# Patient Record
Sex: Male | Born: 1990 | Race: Black or African American | Hispanic: No | Marital: Single | Smoking: Current every day smoker
Health system: Southern US, Community
[De-identification: ages and names within clinical notes are randomized; demographics above are authoritative.]

## PROBLEM LIST (undated history)

## (undated) DIAGNOSIS — F1910 Other psychoactive substance abuse, uncomplicated: Secondary | ICD-10-CM

## (undated) DIAGNOSIS — S71132A Puncture wound without foreign body, left thigh, initial encounter: Secondary | ICD-10-CM

## (undated) DIAGNOSIS — F32A Depression, unspecified: Secondary | ICD-10-CM

## (undated) DIAGNOSIS — F99 Mental disorder, not otherwise specified: Secondary | ICD-10-CM

## (undated) DIAGNOSIS — B182 Chronic viral hepatitis C: Secondary | ICD-10-CM

## (undated) DIAGNOSIS — W3400XA Accidental discharge from unspecified firearms or gun, initial encounter: Secondary | ICD-10-CM

## (undated) DIAGNOSIS — F419 Anxiety disorder, unspecified: Secondary | ICD-10-CM

## (undated) DIAGNOSIS — F329 Major depressive disorder, single episode, unspecified: Secondary | ICD-10-CM

## (undated) HISTORY — PX: OTHER SURGICAL HISTORY: SHX169

## (undated) HISTORY — PX: MOUTH SURGERY: SHX715

---
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
## 2008-02-17 ENCOUNTER — Emergency Department (HOSPITAL_COMMUNITY): Admission: EM | Admit: 2008-02-17 | Discharge: 2008-02-17 | Payer: Self-pay | Admitting: Emergency Medicine

## 2008-12-11 ENCOUNTER — Emergency Department (HOSPITAL_COMMUNITY): Admission: EM | Admit: 2008-12-11 | Discharge: 2008-12-11 | Payer: Self-pay | Admitting: Pediatrics

## 2009-01-02 ENCOUNTER — Emergency Department (HOSPITAL_COMMUNITY): Admission: EM | Admit: 2009-01-02 | Discharge: 2009-01-02 | Payer: Self-pay | Admitting: Family Medicine

## 2009-01-07 ENCOUNTER — Emergency Department (HOSPITAL_COMMUNITY): Admission: EM | Admit: 2009-01-07 | Discharge: 2009-01-07 | Payer: Self-pay | Admitting: Family Medicine

## 2009-01-09 ENCOUNTER — Emergency Department (HOSPITAL_COMMUNITY): Admission: EM | Admit: 2009-01-09 | Discharge: 2009-01-09 | Payer: Self-pay | Admitting: Family Medicine

## 2009-01-19 ENCOUNTER — Emergency Department (HOSPITAL_COMMUNITY): Admission: EM | Admit: 2009-01-19 | Discharge: 2009-01-19 | Payer: Self-pay | Admitting: Emergency Medicine

## 2009-11-03 ENCOUNTER — Emergency Department (HOSPITAL_COMMUNITY): Admission: EM | Admit: 2009-11-03 | Discharge: 2009-11-03 | Payer: Self-pay | Admitting: Family Medicine

## 2009-12-16 ENCOUNTER — Emergency Department (HOSPITAL_COMMUNITY): Admission: EM | Admit: 2009-12-16 | Discharge: 2009-12-16 | Payer: Self-pay | Admitting: Emergency Medicine

## 2009-12-21 ENCOUNTER — Emergency Department (HOSPITAL_COMMUNITY): Admission: EM | Admit: 2009-12-21 | Discharge: 2009-12-22 | Payer: Self-pay | Admitting: Emergency Medicine

## 2010-03-21 ENCOUNTER — Emergency Department (HOSPITAL_COMMUNITY): Admission: EM | Admit: 2010-03-21 | Discharge: 2010-03-22 | Payer: Self-pay | Admitting: Emergency Medicine

## 2010-04-15 ENCOUNTER — Emergency Department (HOSPITAL_COMMUNITY): Admission: EM | Admit: 2010-04-15 | Discharge: 2010-04-16 | Payer: Self-pay | Admitting: Emergency Medicine

## 2010-05-09 ENCOUNTER — Emergency Department (HOSPITAL_COMMUNITY): Admission: EM | Admit: 2010-05-09 | Discharge: 2010-05-09 | Payer: Self-pay | Admitting: Emergency Medicine

## 2010-05-15 ENCOUNTER — Emergency Department (HOSPITAL_COMMUNITY): Admission: EM | Admit: 2010-05-15 | Discharge: 2010-05-15 | Payer: Self-pay | Admitting: Emergency Medicine

## 2010-11-08 ENCOUNTER — Emergency Department (HOSPITAL_COMMUNITY)
Admission: EM | Admit: 2010-11-08 | Discharge: 2010-11-08 | Payer: Self-pay | Source: Home / Self Care | Admitting: Emergency Medicine

## 2010-11-10 ENCOUNTER — Emergency Department (HOSPITAL_COMMUNITY)
Admission: EM | Admit: 2010-11-10 | Discharge: 2010-11-10 | Payer: Self-pay | Source: Home / Self Care | Admitting: Emergency Medicine

## 2011-01-10 ENCOUNTER — Emergency Department (HOSPITAL_COMMUNITY)
Admission: EM | Admit: 2011-01-10 | Discharge: 2011-01-10 | Disposition: A | Discharge status: Home / Self Care | Payer: Medicaid Other | Attending: Emergency Medicine | Admitting: Emergency Medicine

## 2011-01-10 DIAGNOSIS — J069 Acute upper respiratory infection, unspecified: Secondary | ICD-10-CM | POA: Insufficient documentation

## 2011-01-10 DIAGNOSIS — I10 Essential (primary) hypertension: Secondary | ICD-10-CM | POA: Insufficient documentation

## 2011-01-10 DIAGNOSIS — Z8673 Personal history of transient ischemic attack (TIA), and cerebral infarction without residual deficits: Secondary | ICD-10-CM | POA: Insufficient documentation

## 2011-01-10 DIAGNOSIS — J3489 Other specified disorders of nose and nasal sinuses: Secondary | ICD-10-CM | POA: Insufficient documentation

## 2011-02-16 LAB — URINE MICROSCOPIC-ADD ON

## 2011-02-16 LAB — URINALYSIS, ROUTINE W REFLEX MICROSCOPIC
Bilirubin Urine: NEGATIVE
Glucose, UA: NEGATIVE mg/dL
Hgb urine dipstick: NEGATIVE
Specific Gravity, Urine: 1.024 (ref 1.005–1.030)

## 2011-02-16 LAB — URINE CULTURE

## 2011-02-16 LAB — GC/CHLAMYDIA PROBE AMP, GENITAL
Chlamydia, DNA Probe: POSITIVE — AB
GC Probe Amp, Genital: NEGATIVE

## 2011-03-10 ENCOUNTER — Emergency Department (HOSPITAL_COMMUNITY)
Admission: EM | Admit: 2011-03-10 | Discharge: 2011-03-10 | Disposition: A | Discharge status: Home / Self Care | Payer: Medicaid Other | Attending: Emergency Medicine | Admitting: Emergency Medicine

## 2011-03-10 DIAGNOSIS — IMO0001 Reserved for inherently not codable concepts without codable children: Secondary | ICD-10-CM | POA: Insufficient documentation

## 2011-03-10 DIAGNOSIS — J069 Acute upper respiratory infection, unspecified: Secondary | ICD-10-CM | POA: Insufficient documentation

## 2011-03-10 DIAGNOSIS — R6883 Chills (without fever): Secondary | ICD-10-CM | POA: Insufficient documentation

## 2011-03-10 DIAGNOSIS — J3489 Other specified disorders of nose and nasal sinuses: Secondary | ICD-10-CM | POA: Insufficient documentation

## 2011-03-10 DIAGNOSIS — R05 Cough: Secondary | ICD-10-CM | POA: Insufficient documentation

## 2011-03-10 DIAGNOSIS — R059 Cough, unspecified: Secondary | ICD-10-CM | POA: Insufficient documentation

## 2011-03-10 DIAGNOSIS — B9789 Other viral agents as the cause of diseases classified elsewhere: Secondary | ICD-10-CM | POA: Insufficient documentation

## 2011-03-10 DIAGNOSIS — R0982 Postnasal drip: Secondary | ICD-10-CM | POA: Insufficient documentation

## 2011-03-10 DIAGNOSIS — M5430 Sciatica, unspecified side: Secondary | ICD-10-CM | POA: Insufficient documentation

## 2011-03-10 DIAGNOSIS — J029 Acute pharyngitis, unspecified: Secondary | ICD-10-CM | POA: Insufficient documentation

## 2011-03-13 ENCOUNTER — Emergency Department (HOSPITAL_COMMUNITY)
Admission: EM | Admit: 2011-03-13 | Discharge: 2011-03-13 | Disposition: A | Discharge status: Home / Self Care | Payer: Medicaid Other | Source: Home / Self Care | Attending: Emergency Medicine | Admitting: Emergency Medicine

## 2011-03-13 ENCOUNTER — Emergency Department (HOSPITAL_COMMUNITY)
Admission: EM | Admit: 2011-03-13 | Discharge: 2011-03-13 | Disposition: A | Discharge status: Home / Self Care | Payer: Medicaid Other | Attending: Emergency Medicine | Admitting: Emergency Medicine

## 2011-03-13 ENCOUNTER — Emergency Department (HOSPITAL_COMMUNITY): Payer: Medicaid Other

## 2011-03-13 ENCOUNTER — Emergency Department (HOSPITAL_COMMUNITY)
Admission: EM | Admit: 2011-03-13 | Discharge: 2011-03-13 | Disposition: A | Discharge status: Home / Self Care | Payer: Self-pay | Attending: Emergency Medicine | Admitting: Emergency Medicine

## 2011-03-13 DIAGNOSIS — M79609 Pain in unspecified limb: Secondary | ICD-10-CM | POA: Insufficient documentation

## 2011-03-13 DIAGNOSIS — IMO0001 Reserved for inherently not codable concepts without codable children: Secondary | ICD-10-CM | POA: Insufficient documentation

## 2011-03-13 DIAGNOSIS — M25569 Pain in unspecified knee: Secondary | ICD-10-CM | POA: Insufficient documentation

## 2011-03-15 ENCOUNTER — Ambulatory Visit (HOSPITAL_COMMUNITY)
Admission: RE | Admit: 2011-03-15 | Discharge: 2011-03-15 | Disposition: A | Discharge status: Home / Self Care | Payer: Medicaid Other | Source: Ambulatory Visit | Attending: Emergency Medicine | Admitting: Emergency Medicine

## 2011-03-15 ENCOUNTER — Other Ambulatory Visit (HOSPITAL_COMMUNITY): Payer: Self-pay | Admitting: Emergency Medicine

## 2011-03-15 DIAGNOSIS — M79605 Pain in left leg: Secondary | ICD-10-CM

## 2011-03-15 DIAGNOSIS — M79609 Pain in unspecified limb: Secondary | ICD-10-CM | POA: Insufficient documentation

## 2011-03-15 DIAGNOSIS — M25559 Pain in unspecified hip: Secondary | ICD-10-CM | POA: Insufficient documentation

## 2011-03-15 DIAGNOSIS — M25569 Pain in unspecified knee: Secondary | ICD-10-CM | POA: Insufficient documentation

## 2011-03-23 LAB — CBC
HCT: 41.4 % (ref 39.0–52.0)
Hemoglobin: 14.1 g/dL (ref 13.0–17.0)
MCHC: 34 g/dL (ref 30.0–36.0)
MCV: 86.4 fL (ref 78.0–100.0)
RBC: 4.79 MIL/uL (ref 4.22–5.81)
WBC: 7.2 10*3/uL (ref 4.0–10.5)

## 2011-03-23 LAB — PROTIME-INR: Prothrombin Time: 14.2 seconds (ref 11.6–15.2)

## 2011-05-18 ENCOUNTER — Emergency Department (HOSPITAL_BASED_OUTPATIENT_CLINIC_OR_DEPARTMENT_OTHER)
Admission: EM | Admit: 2011-05-18 | Discharge: 2011-05-18 | Disposition: A | Discharge status: Home / Self Care | Payer: Medicaid Other | Attending: Emergency Medicine | Admitting: Emergency Medicine

## 2011-05-18 DIAGNOSIS — S90569A Insect bite (nonvenomous), unspecified ankle, initial encounter: Secondary | ICD-10-CM | POA: Insufficient documentation

## 2011-05-18 DIAGNOSIS — IMO0001 Reserved for inherently not codable concepts without codable children: Secondary | ICD-10-CM | POA: Insufficient documentation

## 2011-05-18 DIAGNOSIS — Y92009 Unspecified place in unspecified non-institutional (private) residence as the place of occurrence of the external cause: Secondary | ICD-10-CM | POA: Insufficient documentation

## 2011-05-18 DIAGNOSIS — W57XXXA Bitten or stung by nonvenomous insect and other nonvenomous arthropods, initial encounter: Secondary | ICD-10-CM | POA: Insufficient documentation

## 2011-05-18 DIAGNOSIS — R21 Rash and other nonspecific skin eruption: Secondary | ICD-10-CM | POA: Insufficient documentation

## 2011-08-30 LAB — TYPE AND SCREEN
ABO/RH(D): A POS
Antibody Screen: NEGATIVE

## 2011-08-30 LAB — CBC
MCV: 84.9
Platelets: 118 — ABNORMAL LOW
WBC: 7.3

## 2011-08-30 LAB — PROTIME-INR
INR: 1.2
Prothrombin Time: 15.7 — ABNORMAL HIGH

## 2011-08-30 LAB — I-STAT 8, (EC8 V) (CONVERTED LAB)
BUN: 18
Bicarbonate: 24.7 — ABNORMAL HIGH
Glucose, Bld: 126 — ABNORMAL HIGH
Hemoglobin: 14.6
Operator id: 161631
pCO2, Ven: 39.7 — ABNORMAL LOW

## 2011-08-30 LAB — ABO/RH: ABO/RH(D): A POS

## 2011-08-30 LAB — DIFFERENTIAL
Eosinophils Absolute: 0
Lymphs Abs: 2.7
Neutro Abs: 4.1
Neutrophils Relative %: 56

## 2011-08-30 LAB — POCT I-STAT CREATININE: Operator id: 161631

## 2011-11-19 ENCOUNTER — Emergency Department (HOSPITAL_COMMUNITY)
Admission: EM | Admit: 2011-11-19 | Discharge: 2011-11-20 | Disposition: A | Discharge status: Home / Self Care | Payer: Self-pay | Attending: Emergency Medicine | Admitting: Emergency Medicine

## 2011-11-19 ENCOUNTER — Other Ambulatory Visit (HOSPITAL_COMMUNITY): Payer: Self-pay

## 2011-11-19 ENCOUNTER — Encounter: Payer: Self-pay | Admitting: Emergency Medicine

## 2011-11-19 ENCOUNTER — Emergency Department (HOSPITAL_COMMUNITY): Payer: Self-pay

## 2011-11-19 DIAGNOSIS — R011 Cardiac murmur, unspecified: Secondary | ICD-10-CM | POA: Insufficient documentation

## 2011-11-19 DIAGNOSIS — R1110 Vomiting, unspecified: Secondary | ICD-10-CM | POA: Insufficient documentation

## 2011-11-19 DIAGNOSIS — R509 Fever, unspecified: Secondary | ICD-10-CM | POA: Insufficient documentation

## 2011-11-19 DIAGNOSIS — R51 Headache: Secondary | ICD-10-CM | POA: Insufficient documentation

## 2011-11-19 DIAGNOSIS — R05 Cough: Secondary | ICD-10-CM | POA: Insufficient documentation

## 2011-11-19 DIAGNOSIS — R059 Cough, unspecified: Secondary | ICD-10-CM | POA: Insufficient documentation

## 2011-11-19 DIAGNOSIS — R6889 Other general symptoms and signs: Secondary | ICD-10-CM | POA: Insufficient documentation

## 2011-11-19 HISTORY — DX: Puncture wound without foreign body, left thigh, initial encounter: S71.132A

## 2011-11-19 HISTORY — DX: Accidental discharge from unspecified firearms or gun, initial encounter: W34.00XA

## 2011-11-19 MED ORDER — IBUPROFEN 800 MG PO TABS
800.0000 mg | ORAL_TABLET | Freq: Once | ORAL | Status: AC
  Administered 2011-11-19: 800 mg via ORAL
  Filled 2011-11-19: qty 1

## 2011-11-19 MED ORDER — HYDROCODONE-ACETAMINOPHEN 5-325 MG PO TABS
1.0000 | ORAL_TABLET | Freq: Once | ORAL | Status: AC
  Administered 2011-11-19: 1 via ORAL
  Filled 2011-11-19: qty 1

## 2011-11-19 MED ORDER — ONDANSETRON HCL 4 MG PO TABS: 4.0000 mg | ORAL_TABLET | Freq: Four times a day (QID) | ORAL | Status: AC

## 2011-11-19 MED ORDER — ALBUTEROL SULFATE (5 MG/ML) 0.5% IN NEBU
5.0000 mg | INHALATION_SOLUTION | Freq: Once | RESPIRATORY_TRACT | Status: AC
  Administered 2011-11-19: 5 mg via RESPIRATORY_TRACT
  Filled 2011-11-19: qty 1

## 2011-11-19 MED ORDER — ONDANSETRON 8 MG PO TBDP
8.0000 mg | ORAL_TABLET | Freq: Once | ORAL | Status: AC
  Administered 2011-11-19: 8 mg via ORAL
  Filled 2011-11-19: qty 1

## 2011-11-19 MED ORDER — IPRATROPIUM BROMIDE 0.02 % IN SOLN
0.5000 mg | Freq: Once | RESPIRATORY_TRACT | Status: AC
  Administered 2011-11-19: 0.5 mg via RESPIRATORY_TRACT
  Filled 2011-11-19: qty 2.5

## 2011-11-19 NOTE — ED Notes (Signed)
Pt states starting last night he has had a cough, fever, vomiting, and body aches
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
## 2011-11-19 NOTE — ED Notes (Signed)
Pt states that, as of last night, he has felt weak, dehydrated, and had body aches.  Pt feels pain upon deep inspiration, lung fields found to be clear upon auscultation.

## 2012-01-07 NOTE — ED Provider Notes (Signed)
Medical screening examination/treatment/procedure(s) were performed by non-physician practitioner and as supervising physician I was immediately available for consultation/collaboration.   Juaquina Machnik A. Amonda Brillhart, MD 01/07/12 1455 

## 2012-05-04 ENCOUNTER — Encounter (HOSPITAL_COMMUNITY): Payer: Self-pay | Admitting: *Deleted

## 2012-05-04 ENCOUNTER — Emergency Department (HOSPITAL_COMMUNITY)
Admission: EM | Admit: 2012-05-04 | Discharge: 2012-05-05 | Disposition: A | Discharge status: Home / Self Care | Payer: Self-pay | Attending: Emergency Medicine | Admitting: Emergency Medicine

## 2012-05-04 DIAGNOSIS — K047 Periapical abscess without sinus: Secondary | ICD-10-CM | POA: Insufficient documentation

## 2012-05-04 NOTE — ED Notes (Signed)
Pt c/o lower right tooth pain x's 2 days. States can't afford a dentist right now. Pt also reports he was told previously to come to ER for antibiotics.

## 2012-05-05 MED ORDER — HYDROMORPHONE HCL PF 2 MG/ML IJ SOLN
2.0000 mg | Freq: Once | INTRAMUSCULAR | Status: AC
  Administered 2012-05-05: 2 mg via INTRAMUSCULAR
  Filled 2012-05-05: qty 1

## 2012-05-05 MED ORDER — PENICILLIN V POTASSIUM 500 MG PO TABS
500.0000 mg | ORAL_TABLET | Freq: Once | ORAL | Status: AC
  Administered 2012-05-05: 500 mg via ORAL
  Filled 2012-05-05: qty 1

## 2012-05-05 MED ORDER — IBUPROFEN 600 MG PO TABS: 600.0000 mg | ORAL_TABLET | Freq: Three times a day (TID) | ORAL | Status: AC | PRN

## 2012-05-05 MED ORDER — PENICILLIN V POTASSIUM 500 MG PO TABS: 500.0000 mg | ORAL_TABLET | Freq: Four times a day (QID) | ORAL | Status: AC

## 2012-05-05 MED ORDER — OXYCODONE-ACETAMINOPHEN 5-325 MG PO TABS: 1.0000 | ORAL_TABLET | ORAL | Status: AC | PRN

## 2012-05-05 NOTE — Discharge Instructions (Signed)
Diet and Dental Disease What you eat affects the health of your teeth. Diet plays an important role in developing healthy teeth and preventing dental disease, such as:  Tooth decay.   Gum (periodontal) disease.   Developmental defects of the enamel. This is when visible surfaces of the tooth do not form properly, leaving the tooth more prone to decay.   Dental erosion. This is when the teeth wear away.  Knowing which foods promote strong teeth and which foods to stay away from can help you prevent poor oral health. If your diet lacks proper nutrients, it may be difficult for the tissues in your mouth to prevent dental disease. FOODS THAT PROMOTE DENTAL DISEASE The following foods either contain acids or create acid in your mouth that increases the risk of tooth decay:  Sugary foods, such as candy and baked goods (cookies, cake).   Soft drinks (carbonated and non-carbonated) such as soda, sports drinks, and fruit juice.   Citrus fruits, such as oranges and lemons.   Berries.   Honey.   Herbal teas that contain berries and other fruits.   Wines and other alcoholic beverages.   Vinegar or vinegar containing foods, such as pickles.   Starchy snacks such as crackers, potato chips, [place] fries, and pasta.  Some of these foods have health benefits. Eat these foods in moderation. The more often you eat these foods, the more frequently you are exposing your teeth to the acid that causes dental diseases. FOODS THAT REDUCE THE RISK OF DENTAL DISEASE Certain foods help to keep the teeth strong and reduce the risk of tooth decay. These foods include:  Dairy products, such as cow's milk and cheese. Eating dairy with a meal or sugary snack reduces the risk of tooth decay.   Gums and foods that substitute sugar with sorbitol, mannitol, and xylitol.   Fluoride containing foods, such as black tea. Fluoride is a natural mineral that protects the teeth from tooth decay. Your caregiver may  recommend fluoride toothpaste or a fluoride supplement.   Breast milk.  DIETARY RECOMMENDATIONS FOR HEALTHY TEETH  Eat a healthy, well-balanced diet with fiber-rich fruits and vegetables and quality proteins (eggs, meat, poultry, and fish). A variety of foods each day in moderation is best.   Avoid frequent sugary snacks in between meals.   Avoid frequent sticky, chewy, sugary candies, such as gummy bears and other candies that stick to the teeth. Avoid sucking on candies for a long time.   Avoid drinks that contain added sugar. Even though they do not sit in the mouth for very long, they can promote tooth decay if consumed too frequently.   Avoid sugary foods and drinks late at night.   Avoid swishing or holding acidic or sugary drinks in your mouth. Using a straw limits contact with the teeth.   If you like frequent sugary treats, try eating a sugary dessert after a meal or with a dairy product, rather than eating it by itself.   Avoid starchy foods such as graham crackers that stick to your teeth.   Eat highly acidic and sugary foods in moderation, especially if you tend to develop tooth decay. Eat citrus fruits or drinks 2 times per day or less. Limit foods with vinegar and sports drinks to 1 time per week.   Try rinsing your mouth with water after a sugary or acidic meal or drink. Rinsing may help to reduce the acid buildup in the mouth.   Limit alcohol.   Read  labels to determine the amount of sugar in foods.  PRACTICE GOOD DAILY ORAL HYGIENE   Have your teeth professionally cleaned at the dentist every 6 months.   Brush twice daily with a fluoride toothpaste.   Floss between your teeth daily.   Ask your caregiver if you need fluoride supplements or treatments.   Ask your caregiver if you should have sealants applied to some of your teeth.  HOME CARE INSTRUCTIONS  Follow the guidelines included here to promote good oral health.   Follow all of your caregiver's  instructions for managing your health condition(s).   See your caregiver for follow-up exams as directed.  Document Released: 07/21/2011 Document Revised: 11/11/2011 Document Reviewed: 07/21/2011 [facility] Patient Information 2012 [place], [place].
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
## 2013-05-30 ENCOUNTER — Emergency Department (HOSPITAL_COMMUNITY): Payer: Medicaid Other

## 2013-05-30 ENCOUNTER — Emergency Department (HOSPITAL_COMMUNITY)
Admission: EM | Admit: 2013-05-30 | Discharge: 2013-05-31 | Disposition: A | Discharge status: Home / Self Care | Payer: Medicaid Other | Attending: Emergency Medicine | Admitting: Emergency Medicine

## 2013-05-30 DIAGNOSIS — S6990XA Unspecified injury of unspecified wrist, hand and finger(s), initial encounter: Secondary | ICD-10-CM | POA: Insufficient documentation

## 2013-05-30 DIAGNOSIS — S0993XA Unspecified injury of face, initial encounter: Secondary | ICD-10-CM | POA: Insufficient documentation

## 2013-05-30 DIAGNOSIS — Z87828 Personal history of other (healed) physical injury and trauma: Secondary | ICD-10-CM | POA: Insufficient documentation

## 2013-05-30 DIAGNOSIS — S99929A Unspecified injury of unspecified foot, initial encounter: Secondary | ICD-10-CM | POA: Insufficient documentation

## 2013-05-30 DIAGNOSIS — S8990XA Unspecified injury of unspecified lower leg, initial encounter: Secondary | ICD-10-CM | POA: Insufficient documentation

## 2013-05-30 DIAGNOSIS — Y92410 Unspecified street and highway as the place of occurrence of the external cause: Secondary | ICD-10-CM | POA: Insufficient documentation

## 2013-05-30 DIAGNOSIS — S59909A Unspecified injury of unspecified elbow, initial encounter: Secondary | ICD-10-CM | POA: Insufficient documentation

## 2013-05-30 DIAGNOSIS — S59919A Unspecified injury of unspecified forearm, initial encounter: Secondary | ICD-10-CM | POA: Insufficient documentation

## 2013-05-30 DIAGNOSIS — Y9389 Activity, other specified: Secondary | ICD-10-CM | POA: Insufficient documentation

## 2013-05-30 MED ORDER — IBUPROFEN 800 MG PO TABS: 800.0000 mg | ORAL_TABLET | Freq: Three times a day (TID) | ORAL | Status: DC

## 2013-05-30 MED ORDER — HYDROCODONE-ACETAMINOPHEN 5-325 MG PO TABS: 2.0000 | ORAL_TABLET | Freq: Four times a day (QID) | ORAL | Status: DC | PRN

## 2013-05-30 MED ORDER — OXYCODONE-ACETAMINOPHEN 5-325 MG PO TABS
2.0000 | ORAL_TABLET | Freq: Once | ORAL | Status: AC
  Administered 2013-05-30: 2 via ORAL
  Filled 2013-05-30: qty 2
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
## 2013-05-31 NOTE — ED Provider Notes (Signed)
Medical screening examination/treatment/procedure(s) were performed by non-physician practitioner and as supervising physician I was immediately available for consultation/collaboration. Devoria Albe, MD, Armando Gang   Ward Givens, MD 05/31/13 1501

## 2013-08-16 ENCOUNTER — Emergency Department (HOSPITAL_COMMUNITY)
Admission: EM | Admit: 2013-08-16 | Discharge: 2013-08-17 | Disposition: A | Discharge status: Home / Self Care | Payer: Medicaid Other | Attending: Emergency Medicine | Admitting: Emergency Medicine

## 2013-08-16 ENCOUNTER — Encounter (HOSPITAL_COMMUNITY): Payer: Self-pay | Admitting: Emergency Medicine

## 2013-08-16 DIAGNOSIS — Z87828 Personal history of other (healed) physical injury and trauma: Secondary | ICD-10-CM | POA: Insufficient documentation

## 2013-08-16 DIAGNOSIS — B86 Scabies: Secondary | ICD-10-CM | POA: Insufficient documentation

## 2013-08-16 MED ORDER — PERMETHRIN 5 % EX CREA: TOPICAL_CREAM | CUTANEOUS | Status: DC

## 2013-08-16 NOTE — ED Notes (Signed)
Pt. reports multiple itchy insect bites at both arms onset yesterday .
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
## 2013-08-17 NOTE — ED Provider Notes (Signed)
Medical screening examination/treatment/procedure(s) were performed by non-physician practitioner and as supervising physician I was immediately available for consultation/collaboration.   Glynn Octave, MD 08/17/13 1329

## 2013-12-20 ENCOUNTER — Emergency Department (HOSPITAL_COMMUNITY)
Admission: EM | Admit: 2013-12-20 | Discharge: 2013-12-20 | Disposition: A | Discharge status: Home / Self Care | Payer: Self-pay | Attending: Emergency Medicine | Admitting: Emergency Medicine

## 2013-12-20 ENCOUNTER — Emergency Department (HOSPITAL_COMMUNITY): Payer: Self-pay

## 2013-12-20 ENCOUNTER — Encounter (HOSPITAL_COMMUNITY): Payer: Self-pay | Admitting: Emergency Medicine

## 2013-12-20 DIAGNOSIS — S0180XA Unspecified open wound of other part of head, initial encounter: Secondary | ICD-10-CM | POA: Insufficient documentation

## 2013-12-20 DIAGNOSIS — S0181XA Laceration without foreign body of other part of head, initial encounter: Secondary | ICD-10-CM

## 2013-12-20 DIAGNOSIS — S0101XA Laceration without foreign body of scalp, initial encounter: Secondary | ICD-10-CM

## 2013-12-20 DIAGNOSIS — S0993XA Unspecified injury of face, initial encounter: Secondary | ICD-10-CM | POA: Insufficient documentation

## 2013-12-20 DIAGNOSIS — S0100XA Unspecified open wound of scalp, initial encounter: Secondary | ICD-10-CM | POA: Insufficient documentation

## 2013-12-20 DIAGNOSIS — S199XXA Unspecified injury of neck, initial encounter: Secondary | ICD-10-CM

## 2013-12-20 DIAGNOSIS — Z23 Encounter for immunization: Secondary | ICD-10-CM | POA: Insufficient documentation

## 2013-12-20 MED ORDER — IBUPROFEN 400 MG PO TABS: 800.0000 mg | ORAL_TABLET | Freq: Once | ORAL | Status: DC

## 2013-12-20 MED ORDER — TETANUS-DIPHTH-ACELL PERTUSSIS 5-2.5-18.5 LF-MCG/0.5 IM SUSP
0.5000 mL | Freq: Once | INTRAMUSCULAR | Status: AC
  Administered 2013-12-20: 0.5 mL via INTRAMUSCULAR
  Filled 2013-12-20: qty 0.5
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
## 2013-12-20 NOTE — ED Provider Notes (Signed)
Medical screening examination/treatment/procedure(s) were performed by non-physician practitioner and as supervising physician I was immediately available for consultation/collaboration.  EKG Interpretation   None         William Rayden Scheper, MD 12/20/13 2336 

## 2013-12-20 NOTE — ED Notes (Signed)
PA at bedside.

## 2013-12-20 NOTE — Discharge Instructions (Signed)
1. Medications:  usual home medications 2. Treatment: rest, drink plenty of fluids, keep wounds clean, and bandages dry 3. Follow Up: Please followup in the ED for suture removal in 5 days  Follow up with your doctor, an urgent care, or this Emergency Department for removal of your stitches in 5 days. Do not submerge the stitches in water for the first 24 hours. Read instructions below.  TREATMENT   Keep the wound clean and dry.   If you were given a bandage (dressing), you should change it at least once a day. Also, change the dressing if it becomes wet or dirty, or as directed by your caregiver.   Wash the wound with soap and water 2 times a day. Rinse the wound off with water to remove all soap. Pat the wound dry with a clean towel.   You may shower as usual after the first 24 hours. Do not soak the wound in water until the sutures are removed.   Once the wound has healed, scarring can be minimized by covering the wound with sunscreen during the day for 1 full year.Marland Kitchen.   SEEK MEDICAL CARE IF:   You have redness, swelling, or increasing pain in the wound.   You see a red line that goes away from the wound.   You have yellowish-white fluid (pus) coming from the wound.   You have a fever.   You notice a bad smell coming from the wound or dressing.   Your wound breaks open before or after sutures have been removed.   You notice something coming out of the wound such as wood or glass.   Your wound is on your hand or foot and you cannot move a finger or toe.   Your pain is not controlled with prescribed medicine.   If you did not receive a tetanus shot today because you thought you were up to date, but did not recall when your last one was given, make sure to check with your primary caregiver to determine if you need one.
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
## 2013-12-31 ENCOUNTER — Emergency Department (HOSPITAL_COMMUNITY)
Admission: EM | Admit: 2013-12-31 | Discharge: 2013-12-31 | Disposition: A | Discharge status: Home / Self Care | Payer: Medicaid Other | Attending: Emergency Medicine | Admitting: Emergency Medicine

## 2013-12-31 ENCOUNTER — Emergency Department (HOSPITAL_COMMUNITY): Payer: Self-pay

## 2013-12-31 ENCOUNTER — Encounter (HOSPITAL_COMMUNITY): Payer: Self-pay | Admitting: Emergency Medicine

## 2013-12-31 DIAGNOSIS — Y9229 Other specified public building as the place of occurrence of the external cause: Secondary | ICD-10-CM | POA: Insufficient documentation

## 2013-12-31 DIAGNOSIS — Z4802 Encounter for removal of sutures: Secondary | ICD-10-CM

## 2013-12-31 DIAGNOSIS — W230XXA Caught, crushed, jammed, or pinched between moving objects, initial encounter: Secondary | ICD-10-CM | POA: Insufficient documentation

## 2013-12-31 DIAGNOSIS — Y939 Activity, unspecified: Secondary | ICD-10-CM | POA: Insufficient documentation

## 2013-12-31 DIAGNOSIS — S60229A Contusion of unspecified hand, initial encounter: Secondary | ICD-10-CM | POA: Insufficient documentation

## 2013-12-31 DIAGNOSIS — S60221A Contusion of right hand, initial encounter: Secondary | ICD-10-CM

## 2013-12-31 MED ORDER — HYDROCODONE-ACETAMINOPHEN 5-325 MG PO TABS: 2.0000 | ORAL_TABLET | ORAL | Status: DC | PRN

## 2013-12-31 NOTE — Progress Notes (Signed)
[facility] provided pt with a list of primary care resources and [facility] information.  °
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
## 2013-12-31 NOTE — Discharge Instructions (Signed)
Contusion °A contusion is a deep bruise. Contusions are the result of an injury that caused bleeding under the skin. The contusion may turn blue, purple, or yellow. Minor injuries will give you a painless contusion, but more severe contusions may stay painful and swollen for a few weeks.  °CAUSES  °A contusion is usually caused by a blow, trauma, or direct force to an area of the body. °SYMPTOMS  °· Swelling and redness of the injured area. °· Bruising of the injured area. °· Tenderness and soreness of the injured area. °· Pain. °DIAGNOSIS  °The diagnosis can be made by taking a history and physical exam. An X-ray, CT scan, or MRI may be needed to determine if there were any associated injuries, such as fractures. °TREATMENT  °Specific treatment will depend on what area of the body was injured. In general, the best treatment for a contusion is resting, icing, elevating, and applying cold compresses to the injured area. Over-the-counter medicines may also be recommended for pain control. Ask your caregiver what the best treatment is for your contusion. °HOME CARE INSTRUCTIONS  °· Put ice on the injured area. °· Put ice in a plastic bag. °· Place a towel between your skin and the bag. °· Leave the ice on for 15-20 minutes, 03-04 times a day. °· Only take over-the-counter or prescription medicines for pain, discomfort, or fever as directed by your caregiver. Your caregiver may recommend avoiding anti-inflammatory medicines (aspirin, ibuprofen, and naproxen) for 48 hours because these medicines may increase bruising. °· Rest the injured area. °· If possible, elevate the injured area to reduce swelling. °SEEK IMMEDIATE MEDICAL CARE IF:  °· You have increased bruising or swelling. °· You have pain that is getting worse. °· Your swelling or pain is not relieved with medicines. °MAKE SURE YOU:  °· Understand these instructions. °· Will watch your condition. °· Will get help right away if you are not doing well or get  worse. °Document Released: 09/01/2005 Document Revised: 02/14/2012 Document Reviewed: 09/27/2011 °ExitCare® Patient Information ©2014 [employer]. ° °Hand Contusion °A hand contusion is a deep bruise on your hand area. Contusions are the result of an injury that caused bleeding under the skin. The contusion may turn blue, purple, or yellow. Minor injuries will give you a painless contusion, but more severe contusions may stay painful and swollen for a few weeks. °CAUSES  °A contusion is usually caused by a blow, trauma, or direct force to an area of the body. °SYMPTOMS  °· Swelling and redness of the injured area. °· Discoloration of the injured area. °· Tenderness and soreness of the injured area. °· Pain. °DIAGNOSIS  °The diagnosis can be made by taking a history and performing a physical exam. An X-ray, CT scan, or MRI may be needed to determine if there were any associated injuries, such as broken bones (fractures). °TREATMENT  °Often, the best treatment for a hand contusion is resting, elevating, icing, and applying cold compresses to the injured area. Over-the-counter medicines may also be recommended for pain control. °HOME CARE INSTRUCTIONS  °· Put ice on the injured area. °· Put ice in a plastic bag. °· Place a towel between your skin and the bag. °· Leave the ice on for 15-20 minutes, 03-04 times a day. °· Only take over-the-counter or prescription medicines as directed by your caregiver. Your caregiver may recommend avoiding anti-inflammatory medicines (aspirin, ibuprofen, and naproxen) for 48 hours because these medicines may increase bruising. °· If told, use an elastic   wrap as directed. This can help reduce swelling. You may remove the wrap for sleeping, showering, and bathing. If your fingers become numb, cold, or blue, take the wrap off and reapply it more loosely.  Elevate your hand with pillows to reduce swelling.  Avoid overusing your hand if it is painful. SEEK IMMEDIATE MEDICAL CARE IF:    You have increased redness, swelling, or pain in your hand.  Your swelling or pain is not relieved with medicines.  You have loss of feeling in your hand or are unable to move your fingers.  Your hand turns cold or blue.  You have pain when you move your fingers.  Your hand becomes warm to the touch.  Your contusion does not improve in 2 days. MAKE SURE YOU:   Understand these instructions.  Will watch your condition.  Will get help right away if you are not doing well or get worse. Document Released: 05/14/2002 Document Revised: 08/16/2012 Document Reviewed: 05/15/2012 [facility] Patient Information 2014 [place]Care, [place]C.  Staple Removal Care After The staples used to close your skin have been removed. The wound needs continued care so it can heal completely and without problems. The care described here will need to be done for another 5-10 days unless your caregiver advises otherwise.  HOME CARE INSTRUCTIONS   Keep wound site dry and clean.  If skin adhesive strips were applied after the staples were removed, they will begin to peel off in a few days. If they remain after fourteen days, they may be peeled off and discarded.  If you still have a dressing, change it at least once a day or as instructed by your caregiver. If the bandage sticks, soak it off with warm water. Pat dry with a clean towel. Look for signs of infection (see below).  Reapply cream or ointment according to your caregiver's instruction. This will help prevent infection and keep the bandage from sticking. Use of a non-stick material over the wound and under the dressing or wrap will also help keep the bandage from sticking.  If the bandage becomes wet, dirty or develops a foul smell, change it as soon as possible.  New scars become sunburned easily. Use sunscreens with protection factor (SPF) of at least 15 when out in the sun.  Only take over-the-counter or prescription medicines for pain, discomfort or  fever as directed by your caregiver. SEEK IMMEDIATE MEDICAL CARE IF:   There is redness, swelling or increasing pain in the wound.  Pus is coming from the wound.  An unexplained oral temperature above 102 F (38.9 C) develops.  You notice a foul smell coming from the wound or dressing.  There is a breaking open of the suture line (edges not staying together) of the wound edges after staples have been removed. Document Released: 11/04/2008 Document Revised: 02/14/2012 Document Reviewed: 11/04/2008 [facility] Patient Information 2014 [place]ExitCare, [place]LLC.

## 2013-12-31 NOTE — ED Provider Notes (Signed)
Medical screening examination/treatment/procedure(s) were performed by non-physician practitioner and as supervising physician I was immediately available for consultation/collaboration.  EKG Interpretation   None        Yamaris Cummings F Deforest Maiden, MD 12/31/13 1935 

## 2014-01-15 ENCOUNTER — Encounter (HOSPITAL_COMMUNITY): Payer: Self-pay | Admitting: Emergency Medicine

## 2014-01-15 ENCOUNTER — Emergency Department (HOSPITAL_COMMUNITY)
Admission: EM | Admit: 2014-01-15 | Discharge: 2014-01-15 | Disposition: A | Discharge status: Home / Self Care | Payer: No Typology Code available for payment source | Attending: Emergency Medicine | Admitting: Emergency Medicine

## 2014-01-15 DIAGNOSIS — W57XXXA Bitten or stung by nonvenomous insect and other nonvenomous arthropods, initial encounter: Principal | ICD-10-CM

## 2014-01-15 DIAGNOSIS — H532 Diplopia: Secondary | ICD-10-CM | POA: Insufficient documentation

## 2014-01-15 DIAGNOSIS — L03211 Cellulitis of face: Secondary | ICD-10-CM | POA: Insufficient documentation

## 2014-01-15 DIAGNOSIS — L0201 Cutaneous abscess of face: Secondary | ICD-10-CM | POA: Insufficient documentation

## 2014-01-15 DIAGNOSIS — L03119 Cellulitis of unspecified part of limb: Secondary | ICD-10-CM

## 2014-01-15 DIAGNOSIS — Z87828 Personal history of other (healed) physical injury and trauma: Secondary | ICD-10-CM | POA: Insufficient documentation

## 2014-01-15 DIAGNOSIS — S60569A Insect bite (nonvenomous) of unspecified hand, initial encounter: Secondary | ICD-10-CM

## 2014-01-15 DIAGNOSIS — Y9389 Activity, other specified: Secondary | ICD-10-CM | POA: Insufficient documentation

## 2014-01-15 DIAGNOSIS — R51 Headache: Secondary | ICD-10-CM | POA: Insufficient documentation

## 2014-01-15 DIAGNOSIS — L02519 Cutaneous abscess of unspecified hand: Secondary | ICD-10-CM | POA: Insufficient documentation

## 2014-01-15 DIAGNOSIS — S60469A Insect bite (nonvenomous) of unspecified finger, initial encounter: Principal | ICD-10-CM

## 2014-01-15 DIAGNOSIS — Y9229 Other specified public building as the place of occurrence of the external cause: Secondary | ICD-10-CM | POA: Insufficient documentation

## 2014-01-15 DIAGNOSIS — L089 Local infection of the skin and subcutaneous tissue, unspecified: Secondary | ICD-10-CM | POA: Insufficient documentation

## 2014-01-15 DIAGNOSIS — L0390 Cellulitis, unspecified: Secondary | ICD-10-CM

## 2014-01-15 DIAGNOSIS — R21 Rash and other nonspecific skin eruption: Secondary | ICD-10-CM | POA: Insufficient documentation

## 2014-01-15 MED ORDER — CLINDAMYCIN HCL 150 MG PO CAPS: 150.0000 mg | ORAL_CAPSULE | Freq: Three times a day (TID) | ORAL | Status: DC

## 2014-01-15 MED ORDER — HYDROCODONE-ACETAMINOPHEN 5-325 MG PO TABS: 1.0000 | ORAL_TABLET | ORAL | Status: DC | PRN

## 2014-01-15 MED ORDER — CLINDAMYCIN HCL 150 MG PO CAPS: 450.0000 mg | ORAL_CAPSULE | Freq: Three times a day (TID) | ORAL | Status: DC

## 2014-01-15 NOTE — Discharge Instructions (Signed)
Call for a follow up appointment with a Family or Primary Care Provider.  °Return if Symptoms worsen.   °Take medication as prescribed.  ° ° ° °Emergency Department Resource Guide °1) Find a Doctor and Pay Out of Pocket °Although you won't have to find out who is covered by your insurance plan, it is a good idea to ask around and get recommendations. You will then need to call the office and see if the doctor you have chosen will accept you as a new patient and what types of options they offer for patients who are self-pay. Some doctors offer discounts or will set up payment plans for their patients who do not have insurance, but you will need to ask so you aren't surprised when you get to your appointment. ° °2) Contact Your Local Health Department °Not all health departments have doctors that can see patients for sick visits, but many do, so it is worth a call to see if yours does. If you don't know where your local health department is, you can check in your phone book. The CDC also has a tool to help you locate your state's health department, and many state websites also have listings of all of their local health departments. ° °3) Find a Walk-in Clinic °If your illness is not likely to be very severe or complicated, you may want to try a walk in clinic. These are popping up all over the country in pharmacies, drugstores, and shopping centers. They're usually staffed by nurse practitioners or physician assistants that have been trained to treat common illnesses and complaints. They're usually fairly quick and inexpensive. However, if you have serious medical issues or chronic medical problems, these are probably not your best option. ° °No Primary Care Doctor: °- Call [facility] Connect at  832-8000 - they can help you locate a primary care doctor that  accepts your insurance, provides certain services, etc. °- Physician Referral Service- [phone] ° °Chronic Pain Problems: °Organization          Address  Phone   Notes  °[facility]  [phone] Patients need to be referred by their primary care doctor.  ° °Medication Assistance: °Organization         Address  Phone   Notes  °[place] [facility] [address] °[place], [place] [address] [phone] --Must be a resident of [place] °-- Must have NO insurance coverage whatsoever (no Medicaid/ Medicare, etc.) °-- The pt. MUST have a primary care doctor that directs their care regularly and follows them in the community °  °[facility]  (866) 331-1348   °[employer]  (888) 892-1162   ° °Agencies that provide inexpensive medical care: °Organization         Address  Phone   Notes  °[facility]  ([phone]   °[facility]    ([phone]) [phone]   °[facility] [address] °[place], [place] [address] [phone]   °[facility] [address], °[place] [phone]   °[facility]    ([phone]) [phone]   °[facility]    ([phone]) [phone]   °[facility] ° [address],  Phone:  ([phone]) [phone], Fax:  [phone] Hours of Operation:  9 am - 6 pm, M-F.  Also accepts Medicaid/Medicare and self-pay.  °[facility] ° [address],   [place] Phone: [phone]) [phone], Fax: [phone]. Hours of Operation:  8:30 am - 5:30 pm, M-F.  Also accepts Medicaid and self-pay.  [facility] [place] [address], [place] Phone: [phone]   [facility] [address], [place], [place] [phone], Ext. [phone] Mondays & Thursdays: 7-9 AM.  First 15 patients are seen on a first come, first serve basis.    Medicaid-accepting [facility] Providers:  Organization         Address  Phone   Notes  [facility] [address], [place] [phone] Also accepts self-pay patients.  [facility] [address], [place]  [phone]   [facility] [address], [place] [phone]   [facility] [address] [place] [address], [place] [phone]) [phone]   Renaye RakersVeita Bland [address], [place]   [phone] Only accepts [place] patients after they have their name applied to their card.   Self-Pay (no insurance) in [facility]:  Organization         Address  Phone   Notes  Sickle Cell Patients, [facility] [address], [place] [phone]) [phone]   [facility] [address], [place] [phone]) [phone]   [facility] [place]  [address], [place] [phone]   [facility]/Dr. Osei-Bonsu  [address] [place] [address], [place] or [phone], [place] [phone]) [phone] Phone number for both [place] and [place] locations is the same.  [facility] [address], [place] [phone]   [facility] [place] [address] [place] [address], [place] or [address] [phone]) [phone]   [facility] [address], [place] [phone], phone; [phone]) [phone], fax Sees patients 1st and 3rd Saturday of every month.  Must not qualify for public or private insurance (i.e. Medicaid, Medicare, [place] Health Choice, Veterans' Benefits)  Household income should be no more than 200% of the poverty level The clinic cannot treat you if you are pregnant or think you are pregnant  Sexually transmitted diseases are not treated at the clinic.

## 2014-01-15 NOTE — ED Notes (Addendum)
Pt report pain and itching from multiple red raised sites on hand and face. Pt awoke this am with multiple red raised spots on skin. Pt stated that he has been staying at a hotel for the last few weeks Pt is very sleepy, dozing off during assessment. VS stable. Denies recent drug or alcohol use
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
## 2014-01-19 NOTE — ED Provider Notes (Signed)
Medical screening examination/treatment/procedure(s) were performed by non-physician practitioner and as supervising physician I was immediately available for consultation/collaboration.  EKG Interpretation   None        Martha K Linker, MD 01/19/14 0708 

## 2014-01-23 ENCOUNTER — Emergency Department (HOSPITAL_COMMUNITY)
Admission: EM | Admit: 2014-01-23 | Discharge: 2014-01-23 | Disposition: A | Discharge status: Home / Self Care | Payer: No Typology Code available for payment source | Attending: Emergency Medicine | Admitting: Emergency Medicine

## 2014-01-23 ENCOUNTER — Encounter (HOSPITAL_COMMUNITY): Payer: Self-pay | Admitting: Emergency Medicine

## 2014-01-23 DIAGNOSIS — R0609 Other forms of dyspnea: Secondary | ICD-10-CM | POA: Insufficient documentation

## 2014-01-23 DIAGNOSIS — R4182 Altered mental status, unspecified: Secondary | ICD-10-CM | POA: Insufficient documentation

## 2014-01-23 DIAGNOSIS — Z792 Long term (current) use of antibiotics: Secondary | ICD-10-CM | POA: Insufficient documentation

## 2014-01-23 DIAGNOSIS — R0989 Other specified symptoms and signs involving the circulatory and respiratory systems: Secondary | ICD-10-CM | POA: Insufficient documentation

## 2014-01-23 DIAGNOSIS — Z87828 Personal history of other (healed) physical injury and trauma: Secondary | ICD-10-CM | POA: Insufficient documentation

## 2014-01-23 LAB — ETHANOL

## 2014-01-23 LAB — CBC
HCT: 44 % (ref 39.0–52.0)
HEMOGLOBIN: 14.7 g/dL (ref 13.0–17.0)
MCH: 29.1 pg (ref 26.0–34.0)
MCHC: 33.4 g/dL (ref 30.0–36.0)
MCV: 87 fL (ref 78.0–100.0)
Platelets: 139 10*3/uL — ABNORMAL LOW (ref 150–400)
RBC: 5.06 MIL/uL (ref 4.22–5.81)
RDW: 14.4 % (ref 11.5–15.5)
WBC: 14.8 10*3/uL — ABNORMAL HIGH (ref 4.0–10.5)

## 2014-01-23 LAB — COMPREHENSIVE METABOLIC PANEL
ALBUMIN: 4.5 g/dL (ref 3.5–5.2)
ALK PHOS: 44 U/L (ref 39–117)
ALT: 63 U/L — ABNORMAL HIGH (ref 0–53)
AST: 41 U/L — ABNORMAL HIGH (ref 0–37)
BILIRUBIN TOTAL: 0.4 mg/dL (ref 0.3–1.2)
BUN: 19 mg/dL (ref 6–23)
CO2: 23 mEq/L (ref 19–32)
CREATININE: 1.96 mg/dL — AB (ref 0.50–1.35)
Calcium: 8.9 mg/dL (ref 8.4–10.5)
Chloride: 99 mEq/L (ref 96–112)
GFR calc non Af Amer: 46 mL/min — ABNORMAL LOW (ref >90)
GFR, EST AFRICAN AMERICAN: 53 mL/min — AB (ref >90)
GLUCOSE: 75 mg/dL (ref 70–99)
POTASSIUM: 5.1 meq/L (ref 3.7–5.3)
Sodium: 138 mEq/L (ref 137–147)
TOTAL PROTEIN: 7.7 g/dL (ref 6.0–8.3)

## 2014-01-23 LAB — ACETAMINOPHEN LEVEL: Acetaminophen (Tylenol), Serum: <15 ug/mL (ref 10–30)

## 2014-01-23 LAB — SALICYLATE LEVEL

## 2014-01-23 MED ORDER — NALOXONE HCL 0.4 MG/ML IJ SOLN
INTRAMUSCULAR | Status: AC
  Administered 2014-01-23: 13:00:00
  Filled 2014-01-23: qty 1

## 2014-01-23 MED ORDER — SODIUM CHLORIDE 0.9 % IV SOLN
Freq: Once | INTRAVENOUS | Status: AC
  Administered 2014-01-23: 14:00:00 via INTRAVENOUS

## 2014-01-23 NOTE — Discharge Instructions (Signed)
As discussed, although you have chosen to leave, please do not hesitate to return if you develop new, or concerning changes in your condition.  Please be sure to follow up with your physician and discussed today's presentation.     Altered Mental Status Altered mental status most often refers to an abnormal change in your responsiveness and awareness. It can affect your speech, thought, mobility, memory, attention span, or alertness. It can range from slight confusion to complete unresponsiveness (coma). Altered mental status can be a sign of a serious underlying medical condition. Rapid evaluation and medical treatment is necessary for patients having an altered mental status. CAUSES   Low blood sugar (hypoglycemia) or diabetes.  Severe loss of body fluids (dehydration) or a body salt (electrolyte) imbalance.  A stroke or other neurologic problem, such as dementia or delirium.  A head injury or tumor.  A drug or alcohol overdose.  Exposure to toxins or poisons.  Depression, anxiety, and stress.  A low oxygen level (hypoxia).  An infection.  Blood loss.  Twitching or shaking (seizure).  Heart problems, such as heart attack or heart rhythm problems (arrhythmias).  A body temperature that is too low or too high (hypothermia or hyperthermia). DIAGNOSIS  A diagnosis is based on your history, symptoms, physical and neurologic examinations, and diagnostic tests. Diagnostic tests may include:  Measurement of your blood pressure, pulse, breathing, and oxygen levels (vital signs).  Blood tests.  Urine tests.  X-ray exams.  A computerized magnetic scan (magnetic resonance imaging, MRI).  A computerized X-ray scan (computed tomography, CT scan). TREATMENT  Treatment will depend on the cause. Treatment may include:  Management of an underlying medical or mental health condition.  Critical care or support in the hospital. HOME CARE INSTRUCTIONS   Only take  over-the-counter or prescription medicines for pain, discomfort, or fever as directed by your caregiver.  Manage underlying conditions as directed by your caregiver.  Eat a healthy, well-balanced diet to maintain strength.  Join a support group or prevention program to cope with the condition or trauma that caused the altered mental status. Ask your caregiver to help choose a program that works for you.  Follow up with your caregiver for further examination, therapy, or testing as directed. SEEK MEDICAL CARE IF:   You feel unwell or have chills.  You or your family notice a change in your behavior or your alertness.  You have trouble following your caregiver's treatment plan.  You have questions or concerns. SEEK IMMEDIATE MEDICAL CARE IF:   You have a rapid heartbeat or have chest pain.  You have difficulty breathing.  You have a fever.  You have a headache with a stiff neck.  You cough up blood.  You have blood in your urine or stool.  You have severe agitation or confusion. MAKE SURE YOU:   Understand these instructions.  Will watch your condition.  Will get help right away if you are not doing well or get worse. Document Released: 05/12/2010 Document Revised: 02/14/2012 Document Reviewed: 05/12/2010 [facility] Patient Information 2014 [place]Care, [place]LL[place].

## 2014-01-23 NOTE — ED Notes (Signed)
Patient given urinal, urinal in appropriate place, pt aware that urine is needed.

## 2014-01-23 NOTE — ED Notes (Signed)
Pt. Is unable to use the restroom at this time, but is aware that we need a urine specimen. Urinal at bedside. 

## 2014-01-23 NOTE — ED Notes (Signed)
Bed: [id number] Expected date:  Expected time:  Means of arrival:  Comments: resp 9, pinpoint pupils, better w/ narcan
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
## 2014-01-23 NOTE — ED Notes (Signed)
Patient informed that urine specimen needed or else catheterization may be necessary.

## 2014-02-13 ENCOUNTER — Encounter (HOSPITAL_COMMUNITY): Payer: Self-pay | Admitting: Emergency Medicine

## 2014-02-13 ENCOUNTER — Emergency Department (HOSPITAL_COMMUNITY)
Admission: EM | Admit: 2014-02-13 | Discharge: 2014-02-13 | Disposition: A | Discharge status: Home / Self Care | Payer: Self-pay | Attending: Emergency Medicine | Admitting: Emergency Medicine

## 2014-02-13 DIAGNOSIS — F17200 Nicotine dependence, unspecified, uncomplicated: Secondary | ICD-10-CM | POA: Insufficient documentation

## 2014-02-13 DIAGNOSIS — Z113 Encounter for screening for infections with a predominantly sexual mode of transmission: Secondary | ICD-10-CM | POA: Insufficient documentation

## 2014-02-13 DIAGNOSIS — Z202 Contact with and (suspected) exposure to infections with a predominantly sexual mode of transmission: Secondary | ICD-10-CM

## 2014-02-13 LAB — HIV ANTIBODY (ROUTINE TESTING W REFLEX): HIV: NONREACTIVE

## 2014-02-13 MED ORDER — AZITHROMYCIN 250 MG PO TABS
1000.0000 mg | ORAL_TABLET | Freq: Once | ORAL | Status: AC
  Administered 2014-02-13: 1000 mg via ORAL
  Filled 2014-02-13: qty 4

## 2014-02-13 MED ORDER — LIDOCAINE HCL 1 % IJ SOLN
INTRAMUSCULAR | Status: AC
  Administered 2014-02-13: 1 mL
  Filled 2014-02-13: qty 20

## 2014-02-13 MED ORDER — CEFTRIAXONE SODIUM 250 MG IJ SOLR
250.0000 mg | Freq: Once | INTRAMUSCULAR | Status: AC
  Administered 2014-02-13: 250 mg via INTRAMUSCULAR
  Filled 2014-02-13: qty 250

## 2014-02-13 NOTE — Progress Notes (Signed)
[facility] provided pt with a list of primary care resources to help patient establish primary care.  °

## 2014-02-13 NOTE — Discharge Instructions (Signed)
Sexually Transmitted Disease A sexually transmitted disease (STD) is a disease or infection that may be passed (transmitted) from person to person, usually during sexual activity. This may happen by way of saliva, semen, blood, vaginal mucus, or urine. Common STDs include:   Gonorrhea.   Chlamydia.   Syphilis.   HIV and AIDS.   Genital herpes.   Hepatitis B and C.   Trichomonas.   Human papillomavirus (HPV).   Pubic lice.   Scabies.  Mites.  Bacterial vaginosis. WHAT ARE CAUSES OF STDs? An STD may be caused by bacteria, a virus, or parasites. STDs are often transmitted during sexual activity if one person is infected. However, they may also be transmitted through nonsexual means. STDs may be transmitted after:   Sexual intercourse with an infected person.   Sharing sex toys with an infected person.   Sharing needles with an infected person or using unclean piercing or tattoo needles.  Having intimate contact with the genitals, mouth, or rectal areas of an infected person.   Exposure to infected fluids during birth. WHAT ARE THE SIGNS AND SYMPTOMS OF STDs? Different STDs have different symptoms. Some people may not have any symptoms. If symptoms are present, they may include:   Painful or bloody urination.   Pain in the pelvis, abdomen, vagina, anus, throat, or eyes.   Skin rash, itching, irritation, growths, sores (lesions), ulcerations, or warts in the genital or anal area.  Abnormal vaginal discharge with or without bad odor.   Penile discharge in men.   Fever.   Pain or bleeding during sexual intercourse.   Swollen glands in the groin area.   Yellow skin and eyes (jaundice). This is seen with hepatitis.   Swollen testicles.  Infertility.  Sores and blisters in the mouth. HOW ARE STDs DIAGNOSED? To make a diagnosis, your health care provider may:   Take a medical history.   Perform a physical exam.   Take a sample of any  discharge for examination.  Swab the throat, cervix, opening to the penis, rectum, or vagina for testing.  Test a sample of your first morning urine.   Perform blood tests.   Perform a Pap smear, if this applies.   Perform a colposcopy.   Perform a laparoscopy.  HOW ARE STDs TREATED? Treatment depends on the STD. Some STDs may be treated but not cured.   Chlamydia, gonorrhea, trichomonas, and syphilis can be cured with antibiotics.   Genital herpes, hepatitis, and HIV can be treated, but not cured, with prescribed medicines. The medicines lessen symptoms.   Genital warts from HPV can be treated with medicine or by freezing, burning (electrocautery), or surgery. Warts may come back.   HPV cannot be cured with medicine or surgery. However, abnormal areas may be removed from the cervix, vagina, or vulva.   If your diagnosis is confirmed, your recent sexual partners need treatment. This is true even if they are symptom-free or have a negative culture or evaluation. They should not have sex until their health care providers say it is OK. HOW CAN I REDUCE MY RISK OF GETTING AN STD?  Use latex condoms, dental dams, and water-soluble lubricants during sexual activity. Do not use petroleum jelly or oils.  Get vaccinated for HPV and hepatitis. If you have not received these vaccines in the past, talk to your health care provider about whether one or both might be right for you.   Avoid risky sex practices that can break the skin.  WHAT SHOULD  I DO IF I THINK I HAVE AN STD?  See your health care provider.   Inform all sexual partners. They should be tested and treated for any STDs.  Do not have sex until your health care provider says it is OK. WHEN SHOULD I GET HELP? Seek immediate medical care if:  You develop severe abdominal pain.  You are a man and notice swelling or pain in the testicles.  You are a woman and notice swelling or pain in your vagina. Document  Released: 02/12/2003 Document Revised: 09/12/2013 Document Reviewed: 06/12/2013 [facility]ExitCare Patient Information 2014 [place], [place]. Safe Sex Safe sex is about reducing the risk of giving or getting a sexually transmitted disease (STD). STDs are spread through sexual contact involving the genitals, mouth, or rectum. Some STDS can be cured and others cannot. Safe sex can also prevent unintended pregnancies.  SAFE SEX PRACTICES  Limit your sexual activity to only one partner who is only having sex with you.  Talk to your partner about their past partners, past STDs, and drug use.  Use a condom every time you have sexual intercourse. This includes vaginal, oral, and anal sexual activity. Both females and males should wear condoms during oral sex. Only use latex or polyurethane condoms and water-based lubricants. Petroleum-based lubricants or oils used to lubricate a condom will weaken the condom and increase the chance that it will break. The condom should be in place from the beginning to the end of sexual activity. Wearing a condom reduces, but does not completely eliminate, your risk of getting or giving a STD. STDs can be spread by contact with skin of surrounding areas.  Get vaccinated for hepatitis B and HPV.  Avoid alcohol and recreational drugs which can affect your judgement. You may forget to use a condom or participate in high-risk sex.  For females, avoid douching after sexual intercourse. Douching can spread an infection farther into the reproductive tract.  Check your body for signs of sores, blisters, rashes, or unusual discharge. See your caregiver if you notice any of these signs.  Avoid sexual contact if you have symptoms of an infection or are being treated for an STD. If you or your partner has herpes, avoid sexual contact when blisters are present. Use condoms at all other times.  See your caregiver for regular screenings, examinations, and tests for STDs. Before having sex with  a new partner, each of you should be screened for STDs and talk about the results with your partner. BENEFITS OF SAFE SEX   There is less of a chance of getting or giving an STD.  You can prevent unwanted or unintended pregnancies.  By discussing safer sex concerns with your partner, you may increase feelings of intimacy, comfort, trust, and honesty between the both of you. Document Released: 12/30/2004 Document Revised: 08/16/2012 Document Reviewed: 05/15/2012 [facility] CommonsExitCare Patient Information 2014 [place]ExitCare, [place]LLC.
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
## 2014-02-13 NOTE — ED Notes (Signed)
Pt states not having any symptoms but wants to be checked for STD; states girlfriend has an odor

## 2014-02-14 LAB — GC/CHLAMYDIA PROBE AMP
CT Probe RNA: NEGATIVE
GC Probe RNA: NEGATIVE

## 2014-02-14 NOTE — ED Provider Notes (Signed)
Medical screening examination/treatment/procedure(s) were performed by non-physician practitioner and as supervising physician I was immediately available for consultation/collaboration.   EKG Interpretation None        Vivian Okelley, MD 02/14/14 1057 

## 2015-07-26 ENCOUNTER — Emergency Department (HOSPITAL_COMMUNITY)
Admission: EM | Admit: 2015-07-26 | Discharge: 2015-07-26 | Disposition: A | Discharge status: Home / Self Care | Payer: Medicaid Other | Attending: Internal Medicine | Admitting: Internal Medicine

## 2015-07-26 ENCOUNTER — Encounter (HOSPITAL_COMMUNITY): Payer: Self-pay | Admitting: Emergency Medicine

## 2015-07-26 DIAGNOSIS — Z792 Long term (current) use of antibiotics: Secondary | ICD-10-CM | POA: Insufficient documentation

## 2015-07-26 DIAGNOSIS — K0889 Other specified disorders of teeth and supporting structures: Secondary | ICD-10-CM

## 2015-07-26 DIAGNOSIS — K088 Other specified disorders of teeth and supporting structures: Secondary | ICD-10-CM | POA: Insufficient documentation

## 2015-07-26 DIAGNOSIS — Z87828 Personal history of other (healed) physical injury and trauma: Secondary | ICD-10-CM | POA: Insufficient documentation

## 2015-07-26 MED ORDER — HYDROCODONE-ACETAMINOPHEN 7.5-325 MG PO TABS: 1.0000 | ORAL_TABLET | ORAL | Status: DC | PRN

## 2015-07-26 MED ORDER — AMOXICILLIN 500 MG PO CAPS: 1000.0000 mg | ORAL_CAPSULE | Freq: Two times a day (BID) | ORAL | Status: DC
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
## 2015-07-26 NOTE — Discharge Instructions (Signed)
Dental Pain °A tooth ache may be caused by cavities (tooth decay). Cavities expose the nerve of the tooth to air and hot or cold temperatures. It may come from an infection or abscess (also called a boil or furuncle) around your tooth. It is also often caused by dental caries (tooth decay). This causes the pain you are having. °DIAGNOSIS  °Your caregiver can diagnose this problem by exam. °TREATMENT  °· If caused by an infection, it may be treated with medications which kill germs (antibiotics) and pain medications as prescribed by your caregiver. Take medications as directed. °· Only take over-the-counter or prescription medicines for pain, discomfort, or fever as directed by your caregiver. °· Whether the tooth ache today is caused by infection or dental disease, you should see your dentist as soon as possible for further care. °SEEK MEDICAL CARE IF: °The exam and treatment you received today has been provided on an emergency basis only. This is not a substitute for complete medical or dental care. If your problem worsens or new problems (symptoms) appear, and you are unable to meet with your dentist, call or return to this location. °SEEK IMMEDIATE MEDICAL CARE IF:  °· You have a fever. °· You develop redness and swelling of your face, jaw, or neck. °· You are unable to open your mouth. °· You have severe pain uncontrolled by pain medicine. °MAKE SURE YOU:  °· Understand these instructions. °· Will watch your condition. °· Will get help right away if you are not doing well or get worse. °Document Released: 11/22/2005 Document Revised: 02/14/2012 Document Reviewed: 07/10/2008 °ExitCare® Patient Information ©2015 [employer], LLC. This information is not intended to replace advice given to you by your health care provider. Make sure you discuss any questions you have with your health care provider. ° °Dental Care and Dentist Visits °Dental care supports good overall health. Regular dental visits can also help you  avoid dental pain, bleeding, infection, and other more serious health problems in the future. It is important to keep the mouth healthy because diseases in the teeth, gums, and other oral tissues can spread to other areas of the body. Some problems, such as diabetes, heart disease, and pre-term labor have been associated with poor oral health.  °See your dentist every 6 months. If you experience emergency problems such as a toothache or broken tooth, go to the dentist right away. If you see your dentist regularly, you may catch problems early. It is easier to be treated for problems in the early stages.  °WHAT TO EXPECT AT A DENTIST VISIT  °Your dentist will look for many common oral health problems and recommend proper treatment. At your regular dental visit, you can expect: °· Gentle cleaning of the teeth and gums. This includes scraping and polishing. This helps to remove the sticky substance around the teeth and gums (plaque). Plaque forms in the mouth shortly after eating. Over time, plaque hardens on the teeth as tartar. If tartar is not removed regularly, it can cause problems. Cleaning also helps remove stains. °· Periodic X-rays. These pictures of the teeth and supporting bone will help your dentist assess the health of your teeth. °· Periodic fluoride treatments. Fluoride is a natural mineral shown to help strengthen teeth. Fluoride treatment involves applying a fluoride gel or varnish to the teeth. It is most commonly done in children. °· Examination of the mouth, tongue, jaws, teeth, and gums to look for any oral health problems, such as: °¨ Cavities (dental caries). This is   decay on the tooth caused by plaque, sugar, and acid in the mouth. It is best to catch a cavity when it is small. °¨ Inflammation of the gums caused by plaque buildup (gingivitis). °¨ Problems with the mouth or malformed or misaligned teeth. °¨ Oral cancer or other diseases of the soft tissues or jaws.  °KEEP YOUR TEETH AND GUMS  HEALTHY °For healthy teeth and gums, follow these general guidelines as well as your dentist's specific advice: °· Have your teeth professionally cleaned at the dentist every 6 months. °· Brush twice daily with a fluoride toothpaste. °· Floss your teeth daily.  °· Ask your dentist if you need fluoride supplements, treatments, or fluoride toothpaste. °· Eat a healthy diet. Reduce foods and drinks with added sugar. °· Avoid smoking. °TREATMENT FOR ORAL HEALTH PROBLEMS °If you have oral health problems, treatment varies depending on the conditions present in your teeth and gums. °· Your caregiver will most likely recommend good oral hygiene at each visit. °· For cavities, gingivitis, or other oral health disease, your caregiver will perform a procedure to treat the problem. This is typically done at a separate appointment. Sometimes your caregiver will refer you to another dental specialist for specific tooth problems or for surgery. °SEEK IMMEDIATE DENTAL CARE IF: °· You have pain, bleeding, or soreness in the gum, tooth, jaw, or mouth area. °· A permanent tooth becomes loose or separated from the gum socket. °· You experience a blow or injury to the mouth or jaw area. °Document Released: 08/04/2011 Document Revised: 02/14/2012 Document Reviewed: 08/04/2011 °ExitCare® Patient Information ©2015 Exit[employer], LLC. This information is not intended to replace advice given to you by your health care provider. Make sure you discuss any questions you have with your health care provider. ° °
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
## 2015-09-29 ENCOUNTER — Encounter (HOSPITAL_COMMUNITY): Payer: Self-pay | Admitting: Emergency Medicine

## 2015-09-29 ENCOUNTER — Emergency Department (HOSPITAL_COMMUNITY)
Admission: EM | Admit: 2015-09-29 | Discharge: 2015-09-29 | Disposition: A | Discharge status: Home / Self Care | Payer: Medicaid Other | Attending: Emergency Medicine | Admitting: Emergency Medicine

## 2015-09-29 DIAGNOSIS — R509 Fever, unspecified: Secondary | ICD-10-CM | POA: Insufficient documentation

## 2015-09-29 DIAGNOSIS — K0381 Cracked tooth: Secondary | ICD-10-CM | POA: Insufficient documentation

## 2015-09-29 DIAGNOSIS — K029 Dental caries, unspecified: Secondary | ICD-10-CM | POA: Insufficient documentation

## 2015-09-29 DIAGNOSIS — Z792 Long term (current) use of antibiotics: Secondary | ICD-10-CM | POA: Insufficient documentation

## 2015-09-29 DIAGNOSIS — Z87828 Personal history of other (healed) physical injury and trauma: Secondary | ICD-10-CM | POA: Insufficient documentation

## 2015-09-29 DIAGNOSIS — K0889 Other specified disorders of teeth and supporting structures: Secondary | ICD-10-CM | POA: Insufficient documentation

## 2015-09-29 DIAGNOSIS — K002 Abnormalities of size and form of teeth: Secondary | ICD-10-CM | POA: Insufficient documentation

## 2015-09-29 MED ORDER — BUPIVACAINE-EPINEPHRINE (PF) 0.5% -1:200000 IJ SOLN
1.8000 mL | Freq: Once | INTRAMUSCULAR | Status: AC
  Administered 2015-09-29: 1.8 mL
  Filled 2015-09-29: qty 1.8

## 2015-09-29 MED ORDER — IBUPROFEN 800 MG PO TABS: 800.0000 mg | ORAL_TABLET | Freq: Three times a day (TID) | ORAL | Status: DC | PRN

## 2015-09-29 MED ORDER — PENICILLIN V POTASSIUM 500 MG PO TABS: 500.0000 mg | ORAL_TABLET | Freq: Four times a day (QID) | ORAL | Status: DC

## 2015-09-29 MED ORDER — BUPIVACAINE HCL 0.5 % IJ SOLN: 1.8000 mL | Freq: Once | INTRAMUSCULAR | Status: DC

## 2015-09-29 MED ORDER — LIDOCAINE VISCOUS 2 % MT SOLN
15.0000 mL | Freq: Once | OROMUCOSAL | Status: AC
  Administered 2015-09-29: 15 mL via OROMUCOSAL
  Filled 2015-09-29: qty 15

## 2015-09-29 MED ORDER — ACETAMINOPHEN-CODEINE #3 300-30 MG PO TABS: 1.0000 | ORAL_TABLET | Freq: Four times a day (QID) | ORAL | Status: DC | PRN

## 2015-09-29 MED ORDER — PENICILLIN V POTASSIUM 500 MG PO TABS
500.0000 mg | ORAL_TABLET | Freq: Once | ORAL | Status: AC
  Administered 2015-09-29: 500 mg via ORAL
  Filled 2015-09-29: qty 1

## 2015-09-29 NOTE — Progress Notes (Signed)
EDCM consulted to speak to patient regarding dental programs in the community.  EDCM spoke to patient at bedside. Patient confirms he does not have a pcp or [place].  [facility] with [facility] to [facility], informed patient of services there and walk in times.  EDCM also provided patient with list of pcps who accept self pay patients, list of discount pharmacies and websites needymeds.org and GoodRX.com for medication assistance, phone number to inquire about the orange card, phone number to inquire about Mediciad, phone number to inquire about the Affordable Care Act, financial resources in the community such as local churches, salvation army, urban ministries, and dental assistance for uninsured patients.  Patient thankful for resources.  No further EDCM needs at this time.  Patient agreeable for [facility] place referral to [facility] rep for orange card. Discussed [place] [facility] program, need for orange card and pcp.  Discussed with EDPA who will refer patient to on call dental.  [facility] informed EDPA patient's need for pain medication.    [facility] referral placed
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
## 2015-09-29 NOTE — Discharge Instructions (Signed)
Read the information below.  Use the prescribed medication as directed.  Please discuss all new medications with your pharmacist.  You may return to the Emergency Department at any time for worsening condition or any new symptoms that concern you.   Please call the dentist listed above within 48 hours to schedule a close follow up appointment.  If you develop fevers, swelling in your face, difficulty swallowing or breathing, return to the ER immediately for a recheck.   ° ° °Emergency Department Resource Guide °1) Find a Doctor and Pay Out of Pocket °Although you won't have to find out who is covered by your insurance plan, it is a good idea to ask around and get recommendations. You will then need to call the office and see if the doctor you have chosen will accept you as a new patient and what types of options they offer for patients who are self-pay. Some doctors offer discounts or will set up payment plans for their patients who do not have insurance, but you will need to ask so you aren't surprised when you get to your appointment. ° °2) Contact Your Local Health Department °Not all health departments have doctors that can see patients for sick visits, but many do, so it is worth a call to see if yours does. If you don't know where your local health department is, you can check in your phone book. The CDC also has a tool to help you locate your state's health department, and many state websites also have listings of all of their local health departments. ° °3) Find a Walk-in Clinic °If your illness is not likely to be very severe or complicated, you may want to try a walk in clinic. These are popping up all over the country in pharmacies, drugstores, and shopping centers. They're usually staffed by nurse practitioners or physician assistants that have been trained to treat common illnesses and complaints. They're usually fairly quick and inexpensive. However, if you have serious medical issues or chronic  medical problems, these are probably not your best option. ° °No Primary Care Doctor: °- Call Health Connect at  832-8000 - they can help you locate a primary care doctor that  accepts your insurance, provides certain services, etc. °- Physician Referral Service- 1-800-533-3463 ° °Chronic Pain Problems: °Organization         Address  Phone   Notes  °[facility]  (336) 297-2271 Patients need to be referred by their primary care doctor.  ° °Medication Assistance: °Organization         Address  Phone   Notes  °[place] [facility] [address] °[place], [place] [address] (336) 641-8030 --Must be a resident of [place] °-- Must have NO insurance coverage whatsoever (no Medicaid/ Medicare, etc.) °-- The pt. MUST have a primary care doctor that directs their care regularly and follows them in the community °  °[facility]  (866) 331-1348   °United Way  (888) 892-1162   ° °Agencies that provide inexpensive medical care: °Organization         Address  Phone   Notes  °[facility]  (336) 832-8035   ° Internal Medicine    (336) 832-7272   °[facility] [address] °[place], [place] [address] (336) 832-4777   °[facility] [address], °[place] (336) 271-4999   °[facility]    (336) 373-0678   °[facility]    (336) 272-1050   °[facility] ° [address], [place] Phone:  (336) 832-4444, Fax:  (336) 832-4440 Hours of Operation:  9 am - 6 pm, M-F.  Also accepts Medicaid/Medicare and self-pay.  °[facility] ° [address], [place] Phone: (336) 832-3150, Fax: (336) 832-3151. Hours of Operation:  8:30 am - 5:30 pm, M-F.  Also accepts Medicaid and self-pay.  °[facility] [place] [address], [place] Phone: (336) 878-6027   °[facility] [address], [place], [place] (336)723-1848,  Ext. 123 Mondays & Thursdays: 7-9 AM.  First 15 patients are seen on a first come, first serve basis. °  ° °Medicaid-accepting [place] Providers: ° °Organization         Address  Phone   Notes  °[facility] [address], [place] (336) 641-2100 Also accepts self-pay patients.  °[facility] [address], [place] ° (336) 856-9996   °[facility] [address], [place] (336) 288-8857   °[facility] [address] [place] [address], [place] (336) 299-7000   °Veita Bland [address], [place]  ° (336) 373-1557 Only accepts [place] Access Medicaid patients after they have their name applied to their card.  ° °Self-Pay (no insurance) in [place]: ° °Organization         Address  Phone   Notes  °Sickle Cell Patients, [facility] [address], [place] (336) 832-1970   °[facility] [address], [place] (336) 832-4400   °[facility] ° [address], [place] (336) 992-4800   °[facility]/Dr. Osei-Bonsu ° [address] [place] [address], [place] or [address], [place] (336) 841-8500 Phone number for both [place] and [place] locations is the same.  °[facility] [address], [place] (336) 299-0000   °[facility] [address] [place] [address], [place] or [address] [address] (336) 852-7530 °(336) 878-2260   °[facility] [address], [place] (336) 350-1642, phone; (336) 294-5005, fax Sees patients 1st and 3rd Saturday of every month.  Must not qualify for public or private insurance (i.e. Medicaid, Medicare, [place] Health Choice, Veterans' Benefits) • Household income should be no more than 200% of the poverty level •The clinic cannot treat you if you are pregnant or think you are pregnant • Sexually transmitted diseases are not  treated at the clinic.  ° ° °Dental Care: °Organization         Address  Phone  Notes  °[place] [facility] [address],  (336) 641-6152 Accepts children up to age 21 who are enrolled in Medicaid or [place] Health Choice; pregnant women with a Medicaid card; and children who have applied for Medicaid or [place] Health Choice, but were declined, whose parents can pay a reduced fee at time of service.  °[place] [facility] [place]  [address], [place] (336) 641-7733 Accepts children up to age 21 who are enrolled in Medicaid or [place] Health Choice; pregnant women with a Medicaid card; and children who have applied for Medicaid or [place] Health Choice, but were declined, whose parents can pay a reduced fee at time of service.  °  [facility] ° [address], [place] (336) 641-4533 Patients are seen by appointment only. Walk-ins are not accepted. [facility] will see patients 18 years of age and older. °Monday - Tuesday (8am-5pm) °Most Wednesdays (8:30-5pm) °$30 per visit, cash only  °[facility] ° [address], [place] (336) 641-4533 Patients are seen by appointment only. Walk-ins are not accepted. [facility] will see patients 18 years of age and older. °One Wednesday Evening (Monthly: Volunteer Based).  $30 per visit, cash only  °[facility]  (919) 537-3737 for adults; Children under age 4, call Graduate Pediatric Dentistry at (919) 537-3956. Children aged 4-14, please call (919) 537-3737 to request a pediatric application. ° Dental services are provided in all areas of dental care including fillings, crowns and bridges, complete and partial dentures, implants, gum treatment, root canals, and extractions. Preventive care is also provided. Treatment is provided to both adults and children. °Patients are selected via a lottery and there is  often a waiting list. °  °[facility] [address], °[place] ° (336) 763-8833 www.drcivils.com °  °[facility] [address], [place], [place] Diamonique Ruedas (336)723-1848, Ext. 123 Second and Fourth Thursday of each month, opens at 6:30 AM; Clinic ends at 9 AM.  Patients are seen on a first-come first-served basis, and a limited number are seen during each clinic.  ° °[facility] ° [address], [place], [place] (336) 723-7904   Eligibility Requirements °You must have lived in [place], or [place] counties for at least the last three months. °  You cannot be eligible for state or federal sponsored healthcare insurance, including Veterans Administration, Medicaid, or Medicare. °  You generally cannot be eligible for healthcare insurance through your employer.  °  How to apply: °Eligibility screenings are held every Tuesday and Wednesday afternoon from 1:00 pm until 4:00 pm. You do not need an appointment for the interview!  °[facility] [address], [place], [place] 336-631-2330   °[place] [facility]  336-342-8273   °[place] [facility]  336-703-3100   °[facility]  336-570-6415   ° °Behavioral Health Resources in the Community: °Intensive Outpatient Programs °Organization         Address  Phone  Notes  °[facility] [address], [place], [place] 336-878-6098   °[facility] [address], [place], [place] 336-832-9800   °[facility] [address], [place], [place] ° 336-882-2125   °[facility] [address],  °[place], [place] 1-800-853-5163 or 336-641-4981   °Substance Abuse Resources °Organization         Address  Phone  Notes  °[facility]  336-882-2125   °[facility]  336-784-9470   °[facility]  336-285-9073   °[facility]  336-845-3988   °Residential & Outpatient Substance  Abuse Program  1-800-659-3381   °Psychological Services °Organization         Address  Phone  Notes  ° Health  336- 832-9600   °[facility]  336- 378-7881   °[facility] [address],  1-800-853-5163 or 336-641-4981   ° °Mobile Crisis Teams °Organization         Address  Phone  Notes  °[facility], Mobile Crisis Care Unit  1-877-626-1772   °Assertive °Psychotherapeutic Services ° [address] [place], [place] 336-834-9664   °Sharon DeEsch [address] °[place] 336-554-5454   ° °Self-Help/Support Groups °Organization         Address  Phone             Notes  °[facility] - variety of support groups  336- 373-1402 Call for more information  °Narcotics Anonymous (NA), [facility] [address], °[place]  2 meetings at this location  ° °Residential Treatment Programs °Organization         Address  Phone  Notes  °[facility] [address],    °[place]  1-866-801-8205   °[facility] ° [address], [place], [place] 704-293-8524   °[facility] [address], [place] 336-845-3988 Admissions: 8am-3pm M-F  °[facility] [address]    °[place], [place] 336-841-1104   °[facility] [address], [place], [place] 336-379-7146   °[facility] [address]  °[place], [place] 336-285-9073   °[facility] - Intensive Outpatient [address], [place], [place] 336-852-3033   °[facility] [address]  °[place], [place] 1-877-615-2722 or 336-784-9470   °[facility] [address] [place], [place] 336-227-7417 Accepts Medicaid  °[facility] [address]  °[employer] [place] 1-800-659-3381 Substance Abuse/Addiction Treatment  ° °[place] Behavioral Health Resources °Organization          Address  Phone  Notes  °[facility]  (888) 581-9988   °Julie Brannon, PhD [address] [place], [place]   (336) 349-5553 or (336) 951-0000   °[facility]   [address] °[place], [place] (336) 349-4454   °[facility] [address], [place], [place] (336) 342-8316 Insurance/Medicaid/sponsorship through [facility]  °[facility] [address]                                    [place], [place] (336) 342-8316 Therapy/tele-psych/case  °[facility] [address]  ° [place], [place] (336) 349-2233    °Dr. Arfeen  (336) 349-4544   °[facility] [place]  [employer] [place] [facility] 1) [address], [place] °2) [address], [place] °3)  [address], [place] (336) 349-3220 °(336) 342-7768 ° °(336) 342-8140   °[place] Child Abuse Hotline (336) 342-1394 or (336) 342-3537 (After Hours)    ° ° ° °
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
## 2015-10-11 ENCOUNTER — Encounter (HOSPITAL_COMMUNITY): Payer: Self-pay | Admitting: Emergency Medicine

## 2015-10-11 ENCOUNTER — Emergency Department (HOSPITAL_COMMUNITY)
Admission: EM | Admit: 2015-10-11 | Discharge: 2015-10-11 | Disposition: A | Discharge status: Home / Self Care | Payer: Medicaid Other | Attending: Emergency Medicine | Admitting: Emergency Medicine

## 2015-10-11 DIAGNOSIS — K029 Dental caries, unspecified: Secondary | ICD-10-CM | POA: Insufficient documentation

## 2015-10-11 DIAGNOSIS — Z792 Long term (current) use of antibiotics: Secondary | ICD-10-CM | POA: Insufficient documentation

## 2015-10-11 DIAGNOSIS — Z87828 Personal history of other (healed) physical injury and trauma: Secondary | ICD-10-CM | POA: Insufficient documentation

## 2015-10-11 MED ORDER — PENICILLIN V POTASSIUM 500 MG PO TABS: 500.0000 mg | ORAL_TABLET | Freq: Four times a day (QID) | ORAL | Status: DC

## 2015-10-11 NOTE — Discharge Instructions (Signed)
1. Medications: penicillin, usual home medications 2. Treatment: rest, drink plenty of fluids, take medications as prescribed 3. Follow Up: Please followup with dentistry within 1 week for discussion of your diagnoses and further evaluation after today's visit; if you do not have a primary care doctor use the resource guide provided to find one; Return to the ER for high fevers, difficulty breathing, difficulty swallowing or other concerning symptoms    Emergency Department Resource Guide 1) Find a Doctor and Pay Out of Pocket Although you won't have to find out who is covered by your insurance plan, it is a good idea to ask around and get recommendations. You will then need to call the office and see if the doctor you have chosen will accept you as a new patient and what types of options they offer for patients who are self-pay. Some doctors offer discounts or will set up payment plans for their patients who do not have insurance, but you will need to ask so you aren't surprised when you get to your appointment.  2) Contact Your Local Health Department Not all health departments have doctors that can see patients for sick visits, but many do, so it is worth a call to see if yours does. If you don't know where your local health department is, you can check in your phone book. The CDC also has a tool to help you locate your state's health department, and many state websites also have listings of all of their local health departments.  3) Find a Walk-in Clinic If your illness is not likely to be very severe or complicated, you may want to try a walk in clinic. These are popping up all over the country in pharmacies, drugstores, and shopping centers. They're usually staffed by nurse practitioners or physician assistants that have been trained to treat common illnesses and complaints. They're usually fairly quick and inexpensive. However, if you have serious medical issues or chronic medical problems,  these are probably not your best option.  No Primary Care Doctor: - Call Health Connect at  [phone] - they can help you locate a primary care doctor that  accepts your insurance, provides certain services, etc. - Physician Referral Service- [phone]  Chronic Pain Problems: Organization         Address  Phone   Notes  [facility]  [phone] Patients need to be referred by their primary care doctor.   Medication Assistance: Organization         Address  Phone   Notes  [facility] [address] [place], [place] [address] [phone] --Must be a resident of [place] -- Must have NO insurance coverage whatsoever (no Medicaid/ Medicare, etc.) -- The pt. MUST have a primary care doctor that directs their care regularly and follows them in the community   MedAssist  [phone]   [employer]  [phone]    Agencies that provide inexpensive medical care: Organization         Address  Phone   Notes  [facility]  [phone]) [phone]   [facility]    [phone]   [facility] [address] [place], [place] [address] [phone]   [facility] [address], [place] [phone]) [phone]   [facility]anned [facility]    [phone]   [facility]    [phone]) [phone]   Community Health and  [address], [place] Phone:  [phone], Fax:  [phone] Hours of Operation:  9 am - 6 pm, M-F.  Also accepts Medicaid/Medicare and self-pay.  [facility] for [place] [address], [address], [place] Phone: [phone], Fax: [phone]. Hours of Operation:  8:30 am - 5:30 pm, M-F.  Also accepts Medicaid and self-pay.  [facility] [address], [place] Phone: [phone]   [place], [place], [place] [phone], Ext. [phone] Mondays &  Thursdays: 7-9 AM.  First 15 patients are seen on a first come, first serve basis.    [place] Providers:  Organization         Address  Phone   Notes  [facility] [address], [place] [phone] Also accepts self-pay patients.  [facility] [id number] [place], [place]  [phone]   [place], [address], [place] [phone]   [facility] [address] [place] [phone]   Lucianne Lei [address], [place]   [phone] Only accepts [place] Access [place] patients after they have their name applied to their card.   Self-Pay (no insurance) in [facility]:  Organization         Address  Phone   Notes  Sickle Cell Patients, [facility] [place] [phone]   [facility] [place] [phone]   [facility] [place]  [place], [place], [place] [phone]   [facility]/Dr. Osei-Bonsu  [address] [place] or [address], [place] [phone] Phone number for both [place] and [place] and [place] locations is the same.  Urgent Medical and [facility] [address] [place] [phone]   [facility] [address], [place] or [address] [phone]   [facility] [address] [place] [phone], phone; [phone], fax Sees patients 1st and 3rd Saturday of every month.  Must not qualify for public or private insurance (i.e. Medicaid, Medicare, [place] Health Choice, Veterans' Benefits)  Household income should be no more than 200% of the poverty level The clinic cannot treat you if you are pregnant or think you are pregnant  Sexually transmitted diseases are not treated at the  clinic.    Dental Care: Organization         Address  Phone  Notes  [facility] of [facility] [place] [phone] Accepts children up to age 20 who are enrolled in [place] or [place]; pregnant women with a Medicaid card; and children who have applied for Medicaid or  Health Choice, but were declined, whose parents can pay a reduced fee at time of service.  [facility] Department of [facility]  [address], [place] [phone] Accepts children up to age 72 who are enrolled in [place] or [place]; pregnant women with a Medicaid card; and children who have applied for Medicaid or  Health Choice, but were declined, whose parents can pay a reduced fee at time of service.  [facility]  [place],  [place] [phone] Patients are seen by appointment only. Walk-ins are not accepted. [place] will see patients 79 years of age and older. Monday - Tuesday (8am-5pm) Most Wednesdays (8:30-5pm) $30 per visit, cash only  [facility] Access [facility]  [address], [facility] [phone] Patients are seen by appointment only. Walk-ins are not accepted. Center will see patients 50 years of age and older. One Wednesday Evening (Monthly: Volunteer Based).  $30 per visit, cash only  [place]  [phone] for adults; Children under age 22, call Graduate Pediatric Dentistry at [phone]. Children aged 59-14, please call [phone] to request a pediatric application.  Dental services are provided in all areas of dental care including fillings, crowns and bridges, complete and partial dentures, implants, gum treatment, root canals, and extractions. Preventive care is also provided. Treatment is provided to both adults and children. Patients are selected via a lottery and there is often a  waiting list.   [facility] [address], [place]  [phone] www.drcivils.com   [facility] [address] [place], [place] [phone], Ext. [phone] Second and Fourth Thursday of each month, opens at 6:30 AM; Clinic ends at 9 AM.  Patients are seen on a first-come first-served basis, and a limited number are seen during each clinic.   [facility]  [address] Hillard Danker [place], [place] [phone]   Eligibility Requirements You must have lived in [place], [place], or [place] counties for at least the last three months.   You cannot be eligible for state or federal sponsored [employer], including [employer], [place], or [employer].   You generally cannot be eligible for healthcare insurance through your employer.    How to apply: Eligibility screenings are held every Tuesday and Wednesday afternoon from 1:00 pm until 4:00 pm. You do not need an appointment for the interview!  [facility] [address] [place], [place]   [place]  [place] Department  [place]  [phone]    Behavioral Health Resources in the Community: Intensive Outpatient Programs Organization         Address  Phone  Notes  [place] [address] [place], [place] [phone]   [facility] [address], [place], [place]   ADS: Alcohol & Drug Svcs [address], [place], [place]   [place] [address],  [place], [place] or [phone]   Substance Abuse Resources Organization         Address  Phone  Notes  Alcohol and Drug Services  [phone]   [place]  [phone]   The [place]   Chinita Pester  [phone]   Residential & Outpatient Substance Abuse  Program  [phone]   Psychological Services Organization         Address  Phone  Notes  [facility] [place]  [place]  [phone]   [place] [address], [place] or [phone]    Mobile Crisis Teams Organization         Address  Phone  Notes  [facility], Mobile Crisis Care Unit  [phone]   [facility]  [address] [place], [place]   [place] Levels [phone]  [address] [place] [phone]    Self-Help/Support Groups Organization         Address  Phone             Notes  [facility] [place] - variety of support groups  [phone]- I7437963 Call for more information  Narcotics Anonymous (NA), [facility] [address], [employer] [place]  2 meetings at this location   Statistician         Address  Phone  Notes  [facility] [address],    [place]  [phone]   [facility]  [address] [place] [address], [place], [place] [phone]   [facility] [address] [place], [place] [phone] Admissions: 8am-3pm M-F  [facility] [address]    [place], [place] [phone]   [facility] [address] [place], [place], [place] [phone]   [facility] [address],  [place], [place] [phone]   [facility] - Intensive Outpatient [address], [place], [place] [phone]   [facility] (Addiction Recovery [facility] [address] [place]  [place], [place] [phone] or [phone]   [facility] ([facility]) [address], [place], [place] [phone] Accepts Medicaid  [facility] [address],  [place] [phone] Substance Abuse/Addiction Treatment   [facility] Organization          Address  Phone  Notes  [facility]  [phone]   Angie Fava, PhD [address] Ervin Knack [place], [place]   [phone] or [phone]   [facility] Behavioral   [address] [place], [place] [phone]   [facility] [address] [place], [place] [phone] Insurance/Medicaid/sponsorship through [facility] and Families [address]                                    [place], [place] [phone] Therapy/tele-psych/case  [facility] [address], [place] [phone]    Dr. Lolly Mustache  [phone]   [facility] [place] [facility]  [employer] [facility] Dept. 1) [address] [place] 2) [address] [place] 3)  [address], [place] [phone]  [phone]   [facility] Child Abuse Hotline 484-784-4766 or [phone] (After Hours)
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
## 2015-10-11 NOTE — ED Notes (Signed)
Pt. Stated, I've had toothache since may.

## 2015-10-11 NOTE — ED Notes (Signed)
Declined W/C at D/C and was escorted to lobby by RN. 

## 2015-10-13 ENCOUNTER — Emergency Department (INDEPENDENT_AMBULATORY_CARE_PROVIDER_SITE_OTHER)
Admission: EM | Admit: 2015-10-13 | Discharge: 2015-10-13 | Disposition: A | Discharge status: Home / Self Care | Payer: Self-pay | Source: Home / Self Care

## 2015-10-13 ENCOUNTER — Encounter (HOSPITAL_COMMUNITY): Payer: Self-pay | Admitting: Emergency Medicine

## 2015-10-13 DIAGNOSIS — K0889 Other specified disorders of teeth and supporting structures: Secondary | ICD-10-CM

## 2015-10-13 MED ORDER — KETOROLAC TROMETHAMINE 60 MG/2ML IM SOLN
60.0000 mg | Freq: Once | INTRAMUSCULAR | Status: AC
  Administered 2015-10-13: 60 mg via INTRAMUSCULAR

## 2015-10-13 MED ORDER — LIDOCAINE VISCOUS 2 % MT SOLN: 20.0000 mL | OROMUCOSAL | Status: DC | PRN

## 2015-10-13 MED ORDER — KETOROLAC TROMETHAMINE 60 MG/2ML IM SOLN
INTRAMUSCULAR | Status: AC
  Filled 2015-10-13: qty 2
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
## 2015-10-13 NOTE — Discharge Instructions (Signed)
Dental Pain °Dental pain may be caused by many things, including: °· Tooth decay (cavities or caries). Cavities cause the nerve of your tooth to be open to air and hot or cold temperatures. This can cause pain or discomfort. °· Abscess or infection. A dental abscess is an area that is full of infected pus from a bacterial infection in the inner part of the tooth (pulp). It usually happens at the end of the tooth's root. °· Injury. °· An unknown reason (idiopathic). °Your pain may be mild or severe. It may only happen when: °· You are chewing. °· You are exposed to hot or cold temperature. °· You are eating or drinking sugary foods or beverages, such as: °¨ Soda. °¨ Candy. °Your pain may also be there all of the time. °HOME CARE °Watch your dental pain for any changes. Do these things to lessen your discomfort: °· Take medicines only as told by your dentist. °· If your dentist tells you to take an antibiotic medicine, finish all of it even if you start to feel better. °· Keep all follow-up visits as told by your dentist. This is important. °· Do not apply heat to the outside of your face. °· Rinse your mouth or gargle with salt water if told by your dentist. This helps with pain and swelling. °¨ You can make salt water by adding ¼ tsp of salt to 1 cup of warm water. °· Apply ice to the painful area of your face: °¨ Put ice in a plastic bag. °¨ Place a towel between your skin and the bag. °¨ Leave the ice on for 20 minutes, 2-3 times per day. °· Avoid foods or drinks that cause you pain, such as: °¨ Very hot or very cold foods or drinks. °¨ Sweet or sugary foods or drinks. °GET HELP IF: °· Your pain is not helped with medicines. °· Your symptoms are worse. °· You have new symptoms. °GET HELP RIGHT AWAY IF: °· You cannot open your mouth. °· You are having trouble breathing or swallowing. °· You have a fever. °· Your face, neck, or jaw is puffy (swollen). °  °This information is not intended to replace advice given to  you by your health care provider. Make sure you discuss any questions you have with your health care provider. °  °Document Released: 05/10/2008 Document Revised: 04/08/2015 Document Reviewed: 11/18/2014 °Elsevier Interactive Patient Education ©2016 [employer] °Benzocaine mouth gel, ointment, solution, or dental paste °What is this medicine? °BENZOCAINE (BEN zoe kane) causes loss of feeling on the skin and in the mouth. This helps relieve mouth pain. °This medicine may be used for other purposes; ask your health care provider or pharmacist if you have questions. °What should I tell my health care provider before I take this medicine? °They need to know if you have any of these conditions: °-mouth sores or infection °-an unusual or allergic reaction to benzocaine, para-aminobenzoic acid (PABA), other medicines, foods, dyes, or preservatives °-pregnant or trying to get pregnant °-breast-feeding °How should I use this medicine? °This medicine is applied to the affected area of the mouth or gums. Wash your hands before and after using this medicine. Follow the directions on the label or those given to you by your doctor or health care professional. Do not use this medicine more often than directed. °Talk to your pediatrician regarding the use of this medicine in children. While this medicine may be used in children as young as 4 months for selected conditions,   precautions do apply. Overdosage: If you think you have taken too much of this medicine contact a poison control center or emergency room at once. NOTE: This medicine is only for you. Do not share this medicine with others. What if I miss a dose? This does not apply. What may interact with this medicine? -sulfonamides like sulfacetamide, sulfamethoxazole, sulfisoxazole, and others This list may not describe all possible interactions. Give your health care provider a list of all the medicines, herbs, non-prescription drugs, or dietary supplements you use.  Also tell them if you smoke, drink alcohol, or use illegal drugs. Some items may interact with your medicine. What should I watch for while using this medicine? This medicine is not for long-term use. Do not use for longer than directed on the label or your doctor or health care professional. Do not use on large areas of broken or damaged skin. Contact your doctor or health care professional if your condition does not start to get better within a few days or if you notice redness, itching or swelling. The affected area of your mouth will be numb. Try to avoid injury to that area. To avoid biting the tongue or cheek, or difficulty swallowing, do not chew gum or food until the numbness wears off. What side effects may I notice from receiving this medicine? Side effects that you should report to your doctor or health care professional as soon as possible: -allergic reactions like skin rash, itching or hives, swelling of the face, lips, or tongue -breathing problems -dizziness or drowsiness -fast or slow heartbeat -headache -increased sweating -restlessness, nervousness, anxiety -seizures -tremor Side effects that usually do not require medical attention (report to your doctor or health care professional if they continue or are bothersome): -redness, swelling, or pain This list may not describe all possible side effects. Call your doctor for medical advice about side effects. You may report side effects to FDA at 1-800-FDA-1088. Where should I keep my medicine? Keep out of the reach of children. Store at room temperature between 15 and 30 degrees C (59 and 86 degrees F). Do not freeze. Throw away any unused medicine after the expiration date. NOTE: This sheet is a summary. It may not cover all possible information. If you have questions about this medicine, talk to your doctor, pharmacist, or health care provider.    2016, Elsevier/Gold Standard. (2008-02-21 14:49:13)
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
## 2015-10-14 ENCOUNTER — Emergency Department (HOSPITAL_COMMUNITY)
Admission: EM | Admit: 2015-10-14 | Discharge: 2015-10-14 | Disposition: A | Discharge status: Home / Self Care | Payer: Self-pay | Attending: Emergency Medicine | Admitting: Emergency Medicine

## 2015-10-14 ENCOUNTER — Encounter (HOSPITAL_COMMUNITY): Payer: Self-pay | Admitting: Emergency Medicine

## 2015-10-14 DIAGNOSIS — R220 Localized swelling, mass and lump, head: Secondary | ICD-10-CM | POA: Insufficient documentation

## 2015-10-14 DIAGNOSIS — K0889 Other specified disorders of teeth and supporting structures: Secondary | ICD-10-CM | POA: Insufficient documentation

## 2015-10-14 DIAGNOSIS — Z792 Long term (current) use of antibiotics: Secondary | ICD-10-CM | POA: Insufficient documentation

## 2015-10-14 DIAGNOSIS — Z79899 Other long term (current) drug therapy: Secondary | ICD-10-CM | POA: Insufficient documentation

## 2015-10-14 DIAGNOSIS — K029 Dental caries, unspecified: Secondary | ICD-10-CM | POA: Insufficient documentation

## 2015-10-14 DIAGNOSIS — Z87828 Personal history of other (healed) physical injury and trauma: Secondary | ICD-10-CM | POA: Insufficient documentation

## 2015-10-14 MED ORDER — BUPIVACAINE-EPINEPHRINE (PF) 0.5% -1:200000 IJ SOLN
1.8000 mL | Freq: Once | INTRAMUSCULAR | Status: AC
  Administered 2015-10-14: 1.8 mL
  Filled 2015-10-14: qty 1.8

## 2015-10-14 MED ORDER — OXYCODONE-ACETAMINOPHEN 5-325 MG PO TABS: 1.0000 | ORAL_TABLET | Freq: Four times a day (QID) | ORAL | Status: DC | PRN

## 2015-10-14 NOTE — ED Notes (Signed)
Patient was alert, oriented and stable upon discharge. RN went over AVS and patient had no further questions.  
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
## 2015-10-14 NOTE — Discharge Instructions (Signed)

## 2015-12-22 ENCOUNTER — Encounter (HOSPITAL_COMMUNITY): Payer: Self-pay | Admitting: Emergency Medicine

## 2016-04-20 ENCOUNTER — Ambulatory Visit (HOSPITAL_COMMUNITY)
Admission: EM | Admit: 2016-04-20 | Discharge: 2016-04-20 | Disposition: A | Discharge status: Home / Self Care | Payer: Medicaid Other | Attending: Family Medicine | Admitting: Family Medicine

## 2016-04-20 ENCOUNTER — Ambulatory Visit (INDEPENDENT_AMBULATORY_CARE_PROVIDER_SITE_OTHER): Payer: Medicaid Other

## 2016-04-20 ENCOUNTER — Encounter (HOSPITAL_COMMUNITY): Payer: Self-pay | Admitting: Emergency Medicine

## 2016-04-20 ENCOUNTER — Emergency Department (HOSPITAL_COMMUNITY)
Admission: EM | Admit: 2016-04-20 | Discharge: 2016-04-21 | Disposition: A | Discharge status: Still In Hospital | Payer: Medicaid Other | Source: Home / Self Care

## 2016-04-20 ENCOUNTER — Encounter (HOSPITAL_COMMUNITY): Payer: Self-pay

## 2016-04-20 DIAGNOSIS — L02413 Cutaneous abscess of right upper limb: Secondary | ICD-10-CM

## 2016-04-20 DIAGNOSIS — T148XXA Other injury of unspecified body region, initial encounter: Secondary | ICD-10-CM

## 2016-04-20 DIAGNOSIS — L03113 Cellulitis of right upper limb: Secondary | ICD-10-CM

## 2016-04-20 DIAGNOSIS — S50851A Superficial foreign body of right forearm, initial encounter: Secondary | ICD-10-CM

## 2016-04-20 DIAGNOSIS — F1910 Other psychoactive substance abuse, uncomplicated: Secondary | ICD-10-CM | POA: Insufficient documentation

## 2016-04-20 DIAGNOSIS — L0291 Cutaneous abscess, unspecified: Secondary | ICD-10-CM

## 2016-04-20 DIAGNOSIS — M795 Residual foreign body in soft tissue: Secondary | ICD-10-CM | POA: Insufficient documentation

## 2016-04-20 LAB — CBC WITH DIFFERENTIAL/PLATELET
Basophils Absolute: 0 10*3/uL (ref 0.0–0.1)
Basophils Relative: 0 %
Eosinophils Absolute: 0 10*3/uL (ref 0.0–0.7)
Eosinophils Relative: 0 %
HEMATOCRIT: 39.5 % (ref 39.0–52.0)
Hemoglobin: 13.2 g/dL (ref 13.0–17.0)
LYMPHS ABS: 1.4 10*3/uL (ref 0.7–4.0)
LYMPHS PCT: 17 %
MCH: 28.1 pg (ref 26.0–34.0)
MCHC: 33.4 g/dL (ref 30.0–36.0)
MCV: 84 fL (ref 78.0–100.0)
Monocytes Absolute: 0.9 10*3/uL (ref 0.1–1.0)
Monocytes Relative: 11 %
NEUTROS ABS: 5.9 10*3/uL (ref 1.7–7.7)
Neutrophils Relative %: 72 %
Platelets: 177 10*3/uL (ref 150–400)
RBC: 4.7 MIL/uL (ref 4.22–5.81)
RDW: 13.7 % (ref 11.5–15.5)
WBC: 8.3 10*3/uL (ref 4.0–10.5)

## 2016-04-20 LAB — COMPREHENSIVE METABOLIC PANEL
ALK PHOS: 36 U/L — AB (ref 38–126)
ALT: 13 U/L — AB (ref 17–63)
AST: 15 U/L (ref 15–41)
Albumin: 3.7 g/dL (ref 3.5–5.0)
Anion gap: 7 (ref 5–15)
BILIRUBIN TOTAL: 0.5 mg/dL (ref 0.3–1.2)
BUN: 10 mg/dL (ref 6–20)
CALCIUM: 9 mg/dL (ref 8.9–10.3)
CO2: 25 mmol/L (ref 22–32)
CREATININE: 1.26 mg/dL — AB (ref 0.61–1.24)
Chloride: 103 mmol/L (ref 101–111)
GFR calc Af Amer: >60 mL/min (ref >60)
Glucose, Bld: 102 mg/dL — ABNORMAL HIGH (ref 65–99)
Potassium: 4.1 mmol/L (ref 3.5–5.1)
Sodium: 135 mmol/L (ref 135–145)
Total Protein: 7.3 g/dL (ref 6.5–8.1)

## 2016-04-20 NOTE — ED Notes (Addendum)
Right forearm pain.  Patient admits he broke a needle in right forearm one week ago.  Patient has been in rehab program one week.  Able to move fingers.  Right radial pulse 2 plus.  Forearm warm to touch
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
## 2016-04-21 ENCOUNTER — Inpatient Hospital Stay (HOSPITAL_COMMUNITY)
Admission: EM | Admit: 2016-04-21 | Discharge: 2016-05-04 | DRG: 581 | Disposition: A | Discharge status: Home / Self Care | Payer: Medicaid Other | Attending: Family Medicine | Admitting: Family Medicine

## 2016-04-21 ENCOUNTER — Encounter (HOSPITAL_COMMUNITY)
Admission: EM | Disposition: A | Discharge status: Home / Self Care | Payer: Self-pay | Source: Home / Self Care | Attending: Internal Medicine

## 2016-04-21 ENCOUNTER — Other Ambulatory Visit (HOSPITAL_COMMUNITY): Payer: Self-pay

## 2016-04-21 ENCOUNTER — Encounter (HOSPITAL_COMMUNITY): Payer: Self-pay | Admitting: Internal Medicine

## 2016-04-21 DIAGNOSIS — I509 Heart failure, unspecified: Secondary | ICD-10-CM | POA: Diagnosis not present

## 2016-04-21 DIAGNOSIS — F1290 Cannabis use, unspecified, uncomplicated: Secondary | ICD-10-CM | POA: Diagnosis present

## 2016-04-21 DIAGNOSIS — F1910 Other psychoactive substance abuse, uncomplicated: Secondary | ICD-10-CM | POA: Diagnosis present

## 2016-04-21 DIAGNOSIS — L03113 Cellulitis of right upper limb: Secondary | ICD-10-CM | POA: Diagnosis present

## 2016-04-21 DIAGNOSIS — B182 Chronic viral hepatitis C: Secondary | ICD-10-CM | POA: Diagnosis present

## 2016-04-21 DIAGNOSIS — F1414 Cocaine abuse with cocaine-induced mood disorder: Secondary | ICD-10-CM | POA: Diagnosis present

## 2016-04-21 DIAGNOSIS — I129 Hypertensive chronic kidney disease with stage 1 through stage 4 chronic kidney disease, or unspecified chronic kidney disease: Secondary | ICD-10-CM | POA: Diagnosis present

## 2016-04-21 DIAGNOSIS — M79601 Pain in right arm: Secondary | ICD-10-CM | POA: Diagnosis present

## 2016-04-21 DIAGNOSIS — M795 Residual foreign body in soft tissue: Secondary | ICD-10-CM | POA: Diagnosis present

## 2016-04-21 DIAGNOSIS — Z79899 Other long term (current) drug therapy: Secondary | ICD-10-CM | POA: Diagnosis not present

## 2016-04-21 DIAGNOSIS — L02413 Cutaneous abscess of right upper limb: Secondary | ICD-10-CM | POA: Diagnosis present

## 2016-04-21 DIAGNOSIS — N182 Chronic kidney disease, stage 2 (mild): Secondary | ICD-10-CM | POA: Diagnosis present

## 2016-04-21 DIAGNOSIS — R894 Abnormal immunological findings in specimens from other organs, systems and tissues: Secondary | ICD-10-CM | POA: Diagnosis not present

## 2016-04-21 HISTORY — PX: IRRIGATION AND DEBRIDEMENT ABSCESS: SHX5252

## 2016-04-21 HISTORY — DX: Chronic viral hepatitis C: B18.2

## 2016-04-21 HISTORY — PX: I&D EXTREMITY: SHX5045

## 2016-04-21 HISTORY — DX: Depression, unspecified: F32.A

## 2016-04-21 HISTORY — DX: Major depressive disorder, single episode, unspecified: F32.9

## 2016-04-21 HISTORY — PX: FOREIGN BODY REMOVAL: SHX962

## 2016-04-21 LAB — CBC
HEMATOCRIT: 38.7 % — AB (ref 39.0–52.0)
Hemoglobin: 12.7 g/dL — ABNORMAL LOW (ref 13.0–17.0)
MCH: 27.7 pg (ref 26.0–34.0)
MCHC: 32.8 g/dL (ref 30.0–36.0)
MCV: 84.5 fL (ref 78.0–100.0)
Platelets: 169 10*3/uL (ref 150–400)
RBC: 4.58 MIL/uL (ref 4.22–5.81)
RDW: 14 % (ref 11.5–15.5)
WBC: 8.6 10*3/uL (ref 4.0–10.5)

## 2016-04-21 LAB — URINALYSIS, ROUTINE W REFLEX MICROSCOPIC
BILIRUBIN URINE: NEGATIVE
Glucose, UA: NEGATIVE mg/dL
Hgb urine dipstick: NEGATIVE
KETONES UR: NEGATIVE mg/dL
LEUKOCYTES UA: NEGATIVE
NITRITE: NEGATIVE
PROTEIN: NEGATIVE mg/dL
Specific Gravity, Urine: 1.018 (ref 1.005–1.030)
pH: 6.5 (ref 5.0–8.0)

## 2016-04-21 LAB — CBC WITH DIFFERENTIAL/PLATELET
Basophils Absolute: 0 10*3/uL (ref 0.0–0.1)
Basophils Relative: 0 %
EOS ABS: 0 10*3/uL (ref 0.0–0.7)
EOS PCT: 1 %
HCT: 39 % (ref 39.0–52.0)
HEMOGLOBIN: 12.6 g/dL — AB (ref 13.0–17.0)
LYMPHS ABS: 1.8 10*3/uL (ref 0.7–4.0)
LYMPHS PCT: 21 %
MCH: 27.3 pg (ref 26.0–34.0)
MCHC: 32.3 g/dL (ref 30.0–36.0)
MCV: 84.6 fL (ref 78.0–100.0)
MONOS PCT: 8 %
Monocytes Absolute: 0.7 10*3/uL (ref 0.1–1.0)
Neutro Abs: 6 10*3/uL (ref 1.7–7.7)
Neutrophils Relative %: 70 %
PLATELETS: 156 10*3/uL (ref 150–400)
RBC: 4.61 MIL/uL (ref 4.22–5.81)
RDW: 14 % (ref 11.5–15.5)
WBC: 8.6 10*3/uL (ref 4.0–10.5)

## 2016-04-21 LAB — COMPREHENSIVE METABOLIC PANEL
ALBUMIN: 3.3 g/dL — AB (ref 3.5–5.0)
ALT: 14 U/L — AB (ref 17–63)
AST: 23 U/L (ref 15–41)
Alkaline Phosphatase: 33 U/L — ABNORMAL LOW (ref 38–126)
Anion gap: 13 (ref 5–15)
BUN: 8 mg/dL (ref 6–20)
CHLORIDE: 99 mmol/L — AB (ref 101–111)
CO2: 25 mmol/L (ref 22–32)
CREATININE: 1.24 mg/dL (ref 0.61–1.24)
Calcium: 9 mg/dL (ref 8.9–10.3)
GFR calc Af Amer: >60 mL/min (ref >60)
GFR calc non Af Amer: >60 mL/min (ref >60)
Glucose, Bld: 111 mg/dL — ABNORMAL HIGH (ref 65–99)
Potassium: 4 mmol/L (ref 3.5–5.1)
SODIUM: 137 mmol/L (ref 135–145)
Total Bilirubin: 1 mg/dL (ref 0.3–1.2)
Total Protein: 6.7 g/dL (ref 6.5–8.1)

## 2016-04-21 LAB — BASIC METABOLIC PANEL
Anion gap: 11 (ref 5–15)
BUN: 10 mg/dL (ref 6–20)
CALCIUM: 9 mg/dL (ref 8.9–10.3)
CO2: 25 mmol/L (ref 22–32)
CREATININE: 1.18 mg/dL (ref 0.61–1.24)
Chloride: 101 mmol/L (ref 101–111)
GFR calc non Af Amer: >60 mL/min (ref >60)
GLUCOSE: 79 mg/dL (ref 65–99)
Potassium: 4.5 mmol/L (ref 3.5–5.1)
Sodium: 137 mmol/L (ref 135–145)

## 2016-04-21 LAB — RAPID URINE DRUG SCREEN, HOSP PERFORMED
Amphetamines: NOT DETECTED
BARBITURATES: NOT DETECTED
Benzodiazepines: POSITIVE — AB
Cocaine: POSITIVE — AB
Opiates: POSITIVE — AB
Tetrahydrocannabinol: NOT DETECTED

## 2016-04-21 SURGERY — IRRIGATION AND DEBRIDEMENT EXTREMITY
Anesthesia: General | Laterality: Right

## 2016-04-21 SURGERY — IRRIGATION AND DEBRIDEMENT EXTREMITY
Anesthesia: General | Site: Arm Lower | Laterality: Right

## 2016-04-21 MED ORDER — PIPERACILLIN-TAZOBACTAM 3.375 G IVPB
3.3750 g | Freq: Three times a day (TID) | INTRAVENOUS | Status: DC
  Administered 2016-04-21 – 2016-04-26 (×15): 3.375 g via INTRAVENOUS
  Filled 2016-04-21 (×18): qty 50

## 2016-04-21 MED ORDER — METHOCARBAMOL 500 MG PO TABS
500.0000 mg | ORAL_TABLET | Freq: Four times a day (QID) | ORAL | Status: DC | PRN
  Administered 2016-04-21 – 2016-05-03 (×20): 500 mg via ORAL
  Filled 2016-04-21 (×20): qty 1

## 2016-04-21 MED ORDER — POTASSIUM CHLORIDE IN NACL 20-0.45 MEQ/L-% IV SOLN: INTRAVENOUS | Status: DC

## 2016-04-21 MED ORDER — PROPOFOL 10 MG/ML IV BOLUS
INTRAVENOUS | Status: AC
Start: 2016-04-21 — End: 2016-04-21
  Filled 2016-04-21: qty 20

## 2016-04-21 MED ORDER — VANCOMYCIN HCL IN DEXTROSE 750-5 MG/150ML-% IV SOLN
750.0000 mg | Freq: Two times a day (BID) | INTRAVENOUS | Status: DC
  Administered 2016-04-21 – 2016-04-24 (×7): 750 mg via INTRAVENOUS
  Filled 2016-04-21 (×9): qty 150

## 2016-04-21 MED ORDER — HYDRALAZINE HCL 20 MG/ML IJ SOLN: 10.0000 mg | INTRAMUSCULAR | Status: DC | PRN

## 2016-04-21 MED ORDER — ACETAMINOPHEN 650 MG RE SUPP: 650.0000 mg | Freq: Four times a day (QID) | RECTAL | Status: DC | PRN

## 2016-04-21 MED ORDER — FENTANYL CITRATE (PF) 250 MCG/5ML IJ SOLN
INTRAMUSCULAR | Status: AC
  Filled 2016-04-21: qty 5

## 2016-04-21 MED ORDER — OXYCODONE HCL 5 MG PO TABS
5.0000 mg | ORAL_TABLET | ORAL | Status: DC | PRN
  Administered 2016-04-21 – 2016-04-22 (×9): 10 mg via ORAL
  Filled 2016-04-21 (×10): qty 2

## 2016-04-21 MED ORDER — ONDANSETRON HCL 4 MG/2ML IJ SOLN
INTRAMUSCULAR | Status: AC
  Filled 2016-04-21: qty 2

## 2016-04-21 MED ORDER — SODIUM CHLORIDE 0.9 % IV SOLN: INTRAVENOUS | Status: AC

## 2016-04-21 MED ORDER — VITAMIN C 500 MG PO TABS: 1000.0000 mg | ORAL_TABLET | Freq: Every day | ORAL | Status: DC

## 2016-04-21 MED ORDER — MIDAZOLAM HCL 2 MG/2ML IJ SOLN
INTRAMUSCULAR | Status: AC
  Filled 2016-04-21: qty 2

## 2016-04-21 MED ORDER — SODIUM CHLORIDE 0.9 % IV SOLN: INTRAVENOUS | Status: DC

## 2016-04-21 MED ORDER — PIPERACILLIN-TAZOBACTAM 3.375 G IVPB
INTRAVENOUS | Status: AC
  Filled 2016-04-21: qty 50

## 2016-04-21 MED ORDER — POLYETHYLENE GLYCOL 3350 17 G PO PACK
17.0000 g | PACK | Freq: Every day | ORAL | Status: DC
  Administered 2016-04-22 – 2016-05-04 (×13): 17 g via ORAL
  Filled 2016-04-21 (×13): qty 1

## 2016-04-21 MED ORDER — ONDANSETRON HCL 4 MG/2ML IJ SOLN: 4.0000 mg | Freq: Four times a day (QID) | INTRAMUSCULAR | Status: DC | PRN

## 2016-04-21 MED ORDER — METHOCARBAMOL 1000 MG/10ML IJ SOLN
500.0000 mg | Freq: Four times a day (QID) | INTRAVENOUS | Status: DC | PRN
  Filled 2016-04-21: qty 5

## 2016-04-21 MED ORDER — HEPARIN SODIUM (PORCINE) 5000 UNIT/ML IJ SOLN
5000.0000 [IU] | Freq: Three times a day (TID) | INTRAMUSCULAR | Status: DC
  Administered 2016-04-21 – 2016-04-22 (×3): 5000 [IU] via SUBCUTANEOUS
  Filled 2016-04-21 (×3): qty 1

## 2016-04-21 MED ORDER — SENNA 8.6 MG PO TABS: 1.0000 | ORAL_TABLET | Freq: Two times a day (BID) | ORAL | Status: DC

## 2016-04-21 MED ORDER — MORPHINE SULFATE (PF) 2 MG/ML IV SOLN
2.0000 mg | INTRAVENOUS | Status: DC | PRN
  Administered 2016-04-21 (×3): 4 mg via INTRAVENOUS
  Administered 2016-04-21: 2 mg via INTRAVENOUS
  Administered 2016-04-21 (×4): 4 mg via INTRAVENOUS
  Filled 2016-04-21 (×7): qty 2
  Filled 2016-04-21: qty 1

## 2016-04-21 MED ORDER — METHOCARBAMOL 500 MG PO TABS
ORAL_TABLET | ORAL | Status: AC
  Filled 2016-04-21: qty 1

## 2016-04-21 MED ORDER — MAGNESIUM CITRATE PO SOLN
1.0000 | Freq: Once | ORAL | Status: DC | PRN
Start: 2016-04-21 — End: 2016-04-21

## 2016-04-21 MED ORDER — MORPHINE SULFATE (PF) 4 MG/ML IV SOLN
INTRAVENOUS | Status: AC
  Filled 2016-04-21: qty 1

## 2016-04-21 MED ORDER — SODIUM CHLORIDE 0.9 % IR SOLN
Status: DC | PRN
  Administered 2016-04-21: 6000 mL
  Administered 2016-04-21: 1000 mL

## 2016-04-21 MED ORDER — PROMETHAZINE HCL 25 MG RE SUPP: 12.5000 mg | Freq: Four times a day (QID) | RECTAL | Status: DC | PRN

## 2016-04-21 MED ORDER — ACETAMINOPHEN 325 MG PO TABS
650.0000 mg | ORAL_TABLET | Freq: Four times a day (QID) | ORAL | Status: DC | PRN
  Filled 2016-04-21: qty 2

## 2016-04-21 MED ORDER — OXYCODONE HCL 5 MG PO TABS
ORAL_TABLET | ORAL | Status: AC
  Administered 2016-04-21: 5 mg
  Filled 2016-04-21: qty 1

## 2016-04-21 MED ORDER — DOCUSATE SODIUM 100 MG PO CAPS
100.0000 mg | ORAL_CAPSULE | Freq: Two times a day (BID) | ORAL | Status: DC
  Administered 2016-04-21 – 2016-05-04 (×27): 100 mg via ORAL
  Filled 2016-04-21 (×27): qty 1

## 2016-04-21 MED ORDER — ONDANSETRON HCL 4 MG PO TABS: 4.0000 mg | ORAL_TABLET | Freq: Four times a day (QID) | ORAL | Status: DC | PRN

## 2016-04-21 MED ORDER — BISACODYL 10 MG RE SUPP: 10.0000 mg | Freq: Every day | RECTAL | Status: DC | PRN

## 2016-04-21 MED ORDER — DIPHENHYDRAMINE HCL 25 MG PO CAPS
25.0000 mg | ORAL_CAPSULE | Freq: Four times a day (QID) | ORAL | Status: DC | PRN
  Administered 2016-04-21: 25 mg via ORAL
  Administered 2016-04-22: 50 mg via ORAL
  Filled 2016-04-21: qty 2
  Filled 2016-04-21: qty 1

## 2016-04-21 MED ORDER — HYDROMORPHONE HCL 1 MG/ML IJ SOLN
1.0000 mg | INTRAMUSCULAR | Status: DC | PRN
  Administered 2016-04-21 – 2016-04-22 (×6): 2 mg via INTRAVENOUS
  Filled 2016-04-21 (×6): qty 2

## 2016-04-21 MED ORDER — POLYETHYLENE GLYCOL 3350 17 G PO PACK: 17.0000 g | PACK | Freq: Every day | ORAL | Status: DC | PRN

## 2016-04-21 MED ORDER — FAMOTIDINE 20 MG PO TABS: 20.0000 mg | ORAL_TABLET | Freq: Two times a day (BID) | ORAL | Status: DC | PRN

## 2016-04-21 MED ORDER — BISACODYL 5 MG PO TBEC
5.0000 mg | DELAYED_RELEASE_TABLET | Freq: Every day | ORAL | Status: DC
  Administered 2016-04-21 – 2016-05-04 (×14): 5 mg via ORAL
  Filled 2016-04-21 (×14): qty 1

## 2016-04-21 SURGICAL SUPPLY — 58 items
BANDAGE ELASTIC 3 VELCRO ST LF (GAUZE/BANDAGES/DRESSINGS) IMPLANT
BANDAGE ELASTIC 4 VELCRO ST LF (GAUZE/BANDAGES/DRESSINGS) ×3 IMPLANT
BNDG CONFORM 2 STRL LF (GAUZE/BANDAGES/DRESSINGS) IMPLANT
BNDG GAUZE ELAST 4 BULKY (GAUZE/BANDAGES/DRESSINGS) ×5 IMPLANT
CORDS BIPOLAR (ELECTRODE) ×3 IMPLANT
COVER SURGICAL LIGHT HANDLE (MISCELLANEOUS) ×3 IMPLANT
CUFF TOURNIQUET SINGLE 18IN (TOURNIQUET CUFF) ×3 IMPLANT
CUFF TOURNIQUET SINGLE 24IN (TOURNIQUET CUFF) IMPLANT
DECANTER SPIKE VIAL GLASS SM (MISCELLANEOUS) ×3 IMPLANT
DRAIN PENROSE 1/4X12 LTX STRL (WOUND CARE) ×2 IMPLANT
DRAPE OEC MINIVIEW 54X84 (DRAPES) IMPLANT
DRAPE SURG 17X23 STRL (DRAPES) ×3 IMPLANT
DRSG ADAPTIC 3X8 NADH LF (GAUZE/BANDAGES/DRESSINGS) ×3 IMPLANT
GAUZE SPONGE 4X4 12PLY STRL (GAUZE/BANDAGES/DRESSINGS) ×3 IMPLANT
GAUZE XEROFORM 1X8 LF (GAUZE/BANDAGES/DRESSINGS) ×3 IMPLANT
GAUZE XEROFORM 5X9 LF (GAUZE/BANDAGES/DRESSINGS) ×2 IMPLANT
GLOVE BIOGEL M 8.0 STRL (GLOVE) ×3 IMPLANT
GLOVE SS BIOGEL STRL SZ 8 (GLOVE) ×1 IMPLANT
GLOVE SUPERSENSE BIOGEL SZ 8 (GLOVE) ×2
GOWN STRL REUS W/ TWL LRG LVL3 (GOWN DISPOSABLE) ×2 IMPLANT
GOWN STRL REUS W/ TWL XL LVL3 (GOWN DISPOSABLE) ×3 IMPLANT
GOWN STRL REUS W/TWL LRG LVL3 (GOWN DISPOSABLE) ×6
GOWN STRL REUS W/TWL XL LVL3 (GOWN DISPOSABLE) ×9
HANDPIECE INTERPULSE COAX TIP (DISPOSABLE)
KIT BASIN OR (CUSTOM PROCEDURE TRAY) ×3 IMPLANT
KIT ROOM TURNOVER OR (KITS) ×3 IMPLANT
MANIFOLD NEPTUNE II (INSTRUMENTS) ×3 IMPLANT
NDL HYPO 25GX1X1/2 BEV (NEEDLE) IMPLANT
NEEDLE HYPO 25GX1X1/2 BEV (NEEDLE) IMPLANT
NS IRRIG 1000ML POUR BTL (IV SOLUTION) ×3 IMPLANT
PACK ORTHO EXTREMITY (CUSTOM PROCEDURE TRAY) ×3 IMPLANT
PAD ARMBOARD 7.5X6 YLW CONV (MISCELLANEOUS) ×6 IMPLANT
PAD CAST 4YDX4 CTTN HI CHSV (CAST SUPPLIES) ×1 IMPLANT
PADDING CAST COTTON 4X4 STRL (CAST SUPPLIES) ×3
PILLOW ARM CARTER ADULT (MISCELLANEOUS) ×2 IMPLANT
SET HNDPC FAN SPRY TIP SCT (DISPOSABLE) IMPLANT
SOLUTION BETADINE 4OZ (MISCELLANEOUS) ×3 IMPLANT
SPECIMEN JAR SMALL (MISCELLANEOUS) ×3 IMPLANT
SPLINT FIBERGLASS 4X30 (CAST SUPPLIES) ×2 IMPLANT
SPONGE LAP 4X18 X RAY DECT (DISPOSABLE) ×3 IMPLANT
SPONGE SCRUB IODOPHOR (GAUZE/BANDAGES/DRESSINGS) ×3 IMPLANT
SUCTION FRAZIER HANDLE 10FR (MISCELLANEOUS) ×2
SUCTION TUBE FRAZIER 10FR DISP (MISCELLANEOUS) IMPLANT
SUT MERSILENE 4 0 P 3 (SUTURE) IMPLANT
SUT PROLENE 3 0 PS 2 (SUTURE) ×2 IMPLANT
SUT PROLENE 4 0 PS 2 18 (SUTURE) IMPLANT
SUT VIC AB 2-0 CT1 27 (SUTURE)
SUT VIC AB 2-0 CT1 TAPERPNT 27 (SUTURE) IMPLANT
SUT VIC AB 3-0 FS2 27 (SUTURE) ×2 IMPLANT
SYR CONTROL 10ML LL (SYRINGE) IMPLANT
TOWEL OR 17X24 6PK STRL BLUE (TOWEL DISPOSABLE) ×3 IMPLANT
TOWEL OR 17X26 10 PK STRL BLUE (TOWEL DISPOSABLE) ×3 IMPLANT
TUBE ANAEROBIC SPECIMEN COL (MISCELLANEOUS) IMPLANT
TUBE CONNECTING 12'X1/4 (SUCTIONS) ×1
TUBE CONNECTING 12X1/4 (SUCTIONS) ×2 IMPLANT
UNDERPAD 30X30 INCONTINENT (UNDERPADS AND DIAPERS) ×3 IMPLANT
WATER STERILE IRR 1000ML POUR (IV SOLUTION) ×3 IMPLANT
YANKAUER SUCT BULB TIP NO VENT (SUCTIONS) ×3 IMPLANT
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
## 2016-04-21 NOTE — Progress Notes (Signed)
Pharmacy Antibiotic Note  Tony Walsh is a 25 y.o. male admitted on 04/21/2016 with R forearm infection due to IVDA with needle retained in the forearm. S/p OR 5/17 for I&D - cultures obtained. Pharmacy has been consulted for Vancomycin and Zosyn dosing. Estimated CrCl 70 ml/min.  Appears that Zosyn 3.375gm IV given in ED ~0130 (during downtime).   Plan: Zosyn 3.375gm IV q8h - doses over 4 hours Vancomycin 750mg  IV q12h Will f/u micro data, renal function, and pt's clinical condition Vanc trough prn     Temp (24hrs), Avg:99.4 F (37.4 C), Min:98.9 F (37.2 C), Max:99.9 F (37.7 C)   Recent Labs Lab 04/20/16 2145  WBC 8.3  CREATININE 1.26*    CrCl cannot be calculated (Unknown ideal weight.).    No Known Allergies  Antimicrobials this admission: 5/17 Vanc >>  5/17 Ziosyn >>   Dose adjustments this admission: n/a  Microbiology results: 5/17 BCx x2:  5/17 abscess cx: 5/17 tissue cx:  Thank you for allowing pharmacy to be a part of this patient's care.  Christoper Fabianaron Jalynne Persico, PharmD, BCPS Clinical pharmacist, pager [phone] 04/21/2016 5:32 AM
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
## 2016-04-21 NOTE — Op Note (Signed)
See [id number]#[id number] Amanda PeaGramig MD
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
## 2016-04-21 NOTE — Progress Notes (Addendum)
PROGRESS NOTE                                                                                                                                                                                                             Patient Demographics:    Tony Walsh, is a 25 y.o. male, DOB - Apr 13, 1991, ZOX:[id number]  Admit date - 04/21/2016   Admitting Physician Eduard Clos, MD  Outpatient Primary MD for the patient is No PCP Per Patient  LOS - 0  Outpatient Specialists:   No chief complaint on file.      Brief Narrative     Tony Walsh is a 25 y.o. male with medical history significant of IV drug abuse presents to the ER because of increasing pain in the right forearm around the right antecubital fossa. Patient had used IV drugs last being an over the last 2 days has increasing swelling pain and fever and chills. Patient was referred to the ER from the urgent care center. X-rays showed right forearm foreign body. Dr. Amanda Pea, hand surgeon was consulted and patient was taken to the OR and at this time patient is status post incision and drainage of the abscess. On exam patient has a right upper extremity dressing done already. Patient otherwise denies any chest pain shortness of breath nausea vomiting or diarrhea.   ED Course: Was started on IV antibiotics after blood cultures were taken.    Subjective:    Tony Walsh today has, No headache, No chest pain, No abdominal pain - No Nausea, No new weakness tingling or numbness, No Cough - SOB. Mild  right arm pain   Assessment  & Plan :     1.Right arm abscess, necrotizing infection, foreign body i.e. needle found. All due to IV cocaine use, underwent incision and drainage by hand surgeon Dr. Amanda Pea on 04/20/2016, continue broad spectrum IV antibiotics which include IV vancomycin and Zosyn until cultures are back. Defer management to hand surgery. Pending  hepatitis and HIV. Pending echo.  2. IV cocaine abuse. Counseled to quit. Watch for withdrawal. Supportive care.  3. CK D stage II. Baseline creatinine around 1.2. Monitor.  4. Hypertension. Monitor for withdrawal, as needed IV hydralazine.    Code Status : Full  Family Communication  : None present  Disposition Plan  : Home in 2-3  days  Barriers For Discharge : Right arm infection  Consults  :  In surgery  Procedures  :   Right arm incision and drainage and foreign body removal.  TTE  DVT Prophylaxis  :    Heparin    Lab Results  Component Value Date   PLT 156 04/21/2016    Antibiotics  :     Anti-infectives    Start     Dose/Rate Route Frequency Ordered Stop   04/21/16 0800  piperacillin-tazobactam (ZOSYN) IVPB 3.375 g     3.375 g 12.5 mL/hr over 240 Minutes Intravenous Every 8 hours 04/21/16 0539     04/21/16 0600  vancomycin (VANCOCIN) IVPB 750 mg/150 ml premix     750 mg 150 mL/hr over 60 Minutes Intravenous Every 12 hours 04/21/16 0539          Objective:   Filed Vitals:   04/21/16 0430 04/21/16 0445 04/21/16 0452 04/21/16 0500  BP: 147/66 144/102 126/71 120/61  Pulse: 88 74 72 65  Temp: 98.9 F (37.2 C)   99 F (37.2 C)  Resp: SpO2: 100% 96% 100% 100%    Wt Readings from Last 3 Encounters:  10/14/15 58.968 kg (130 lb)  10/11/15 104.327 kg (230 lb)  07/26/15 104.327 kg (230 lb)     Intake/Output Summary (Last 24 hours) at 04/21/16 1245 Last data filed at 04/21/16 0830  Gross per 24 hour  Intake    877 ml  Output    550 ml  Net    327 ml     Physical Exam  Awake Alert, Oriented X 3, No new F.N deficits, Normal affect Vevay.AT,PERRAL Supple Neck,No JVD, No cervical lymphadenopathy appriciated.  Symmetrical Chest wall movement, Good air movement bilaterally, CTAB RRR,No Gallops,Rubs or new Murmurs, No Parasternal Heave +ve B.Sounds, Abd Soft, No tenderness, No organomegaly appriciated, No rebound - guarding or  rigidity. No Cyanosis, Clubbing or edema, No new Rash or bruise  R arm in badage    Data Review:    CBC  Recent Labs Lab 04/20/16 2145 04/21/16 0628 04/21/16 0812  WBC 8.3 8.6 8.6  HGB 13.2 12.7* 12.6*  HCT 39.5 38.7* 39.0  PLT 177 169 156  MCV 84.0 84.5 84.6  MCH 28.1 27.7 27.3  MCHC 33.4 32.8 32.3  RDW 13.7 14.0 14.0  LYMPHSABS 1.4  --  1.8  MONOABS 0.9  --  0.7  EOSABS 0.0  --  0.0  BASOSABS 0.0  --  0.0    Chemistries   Recent Labs Lab 04/20/16 2145 04/21/16 0628 04/21/16 0812  NA 135 137 137  K 4.1 4.5 4.0  CL 103 101 99*  CO2 GLUCOSE 102* 79 111*  BUN CREATININE 1.26* 1.18 1.24  CALCIUM 9.0 9.0 9.0  AST 15  --  23  ALT 13*  --  14*  ALKPHOS 36*  --  33*  BILITOT 0.5  --  1.0   ------------------------------------------------------------------------------------------------------------------ No results for input(s): CHOL, HDL, LDLCALC, TRIG, CHOLHDL, LDLDIRECT in the last 72 hours.  No results found for: HGBA1C ------------------------------------------------------------------------------------------------------------------ No results for input(s): TSH, T4TOTAL, T3FREE, THYROIDAB in the last 72 hours.  Invalid input(s): FREET3 ------------------------------------------------------------------------------------------------------------------ No results for input(s): VITAMINB12, FOLATE, FERRITIN, TIBC, IRON, RETICCTPCT in the last 72 hours.  Coagulation profile No results for input(s): INR, PROTIME in the last 168 hours.  No results for input(s): DDIMER in the last 72 hours.  Cardiac Enzymes  No results for input(s): CKMB, TROPONINI, MYOGLOBIN in the last 168 hours.  Invalid input(s): CK ------------------------------------------------------------------------------------------------------------------ No results found for: BNP  Inpatient Medications  Scheduled Meds: . docusate sodium  100 mg Oral BID  .  piperacillin-tazobactam (ZOSYN)  IV  3.375 g Intravenous Q8H  . vancomycin  750 mg Intravenous Q12H   Continuous Infusions: . sodium chloride     PRN Meds:.acetaminophen **OR** acetaminophen, bisacodyl, diphenhydrAMINE, famotidine, hydrALAZINE, methocarbamol **OR** methocarbamol (ROBAXIN)  IV, morphine injection, ondansetron **OR** ondansetron (ZOFRAN) IV, oxyCODONE, polyethylene glycol, promethazine  Micro Results No results found for this or any previous visit (from the past 240 hour(s)).  Radiology Reports Dg Forearm Right  04/20/2016  CLINICAL DATA:  The patient thinks he broke a needle in right arm. EXAM: RIGHT FOREARM - 2 VIEW COMPARISON:  None. FINDINGS: There is a 1.3 cm radiopaque needle in the lateral soft tissues of the right forearm with associated soft tissue swelling. The bones are normal. IMPRESSION: There is a 1.3 cm radiopaque needle in the lateral soft tissues of the right forearm with associated soft tissue swelling. Electronically Signed   By: Sherian ReinWei-Chen  Lin M.D.   On: 04/20/2016 21:26    Time Spent in minutes  30   SINGH,PRASHANT K M.D on 04/21/2016 at 12:45 PM  Between 7am to 7pm - Pager - [phone]  After 7pm go to [email or web address] - password [email or web address]  [facility] -  Office  [phone]

## 2016-04-22 ENCOUNTER — Encounter (HOSPITAL_COMMUNITY): Payer: Self-pay | Admitting: Orthopedic Surgery

## 2016-04-22 ENCOUNTER — Other Ambulatory Visit (HOSPITAL_COMMUNITY): Payer: Self-pay

## 2016-04-22 DIAGNOSIS — B182 Chronic viral hepatitis C: Secondary | ICD-10-CM | POA: Diagnosis present

## 2016-04-22 DIAGNOSIS — F1414 Cocaine abuse with cocaine-induced mood disorder: Secondary | ICD-10-CM

## 2016-04-22 DIAGNOSIS — R894 Abnormal immunological findings in specimens from other organs, systems and tissues: Secondary | ICD-10-CM

## 2016-04-22 HISTORY — DX: Chronic viral hepatitis C: B18.2

## 2016-04-22 LAB — BASIC METABOLIC PANEL
Anion gap: 12 (ref 5–15)
BUN: 7 mg/dL (ref 6–20)
CO2: 27 mmol/L (ref 22–32)
CREATININE: 1.33 mg/dL — AB (ref 0.61–1.24)
Calcium: 8.9 mg/dL (ref 8.9–10.3)
Chloride: 96 mmol/L — ABNORMAL LOW (ref 101–111)
GFR calc Af Amer: >60 mL/min (ref >60)
GLUCOSE: 79 mg/dL (ref 65–99)
POTASSIUM: 4.4 mmol/L (ref 3.5–5.1)
SODIUM: 135 mmol/L (ref 135–145)

## 2016-04-22 LAB — PROTIME-INR
INR: 1.06 (ref 0.00–1.49)
Prothrombin Time: 14 seconds (ref 11.6–15.2)

## 2016-04-22 LAB — FERRITIN: Ferritin: 155 ng/mL (ref 24–336)

## 2016-04-22 LAB — HEPATITIS PANEL, ACUTE
HCV Ab: >11 s/co ratio — ABNORMAL HIGH (ref 0.0–0.9)
HEP B C IGM: NEGATIVE
HEP B S AG: NEGATIVE
Hep A IgM: NEGATIVE

## 2016-04-22 LAB — CBC
HCT: 37.2 % — ABNORMAL LOW (ref 39.0–52.0)
Hemoglobin: 12.6 g/dL — ABNORMAL LOW (ref 13.0–17.0)
MCH: 28.9 pg (ref 26.0–34.0)
MCHC: 33.9 g/dL (ref 30.0–36.0)
MCV: 85.3 fL (ref 78.0–100.0)
PLATELETS: 174 10*3/uL (ref 150–400)
RBC: 4.36 MIL/uL (ref 4.22–5.81)
RDW: 13.8 % (ref 11.5–15.5)
WBC: 5.4 10*3/uL (ref 4.0–10.5)

## 2016-04-22 LAB — HIV ANTIBODY (ROUTINE TESTING W REFLEX): HIV Screen 4th Generation wRfx: NONREACTIVE

## 2016-04-22 MED ORDER — HYDROMORPHONE HCL 1 MG/ML IJ SOLN
2.0000 mg | Freq: Every day | INTRAMUSCULAR | Status: DC | PRN
  Administered 2016-04-22 – 2016-04-24 (×6): 2 mg via INTRAVENOUS
  Filled 2016-04-22 (×6): qty 2

## 2016-04-22 MED ORDER — KETOROLAC TROMETHAMINE 30 MG/ML IJ SOLN
30.0000 mg | Freq: Four times a day (QID) | INTRAMUSCULAR | Status: AC | PRN
  Administered 2016-04-22 – 2016-04-27 (×7): 30 mg via INTRAVENOUS
  Filled 2016-04-22 (×7): qty 1

## 2016-04-22 MED ORDER — HYDROMORPHONE HCL 2 MG PO TABS
4.0000 mg | ORAL_TABLET | ORAL | Status: DC | PRN
  Administered 2016-04-22 – 2016-04-23 (×6): 4 mg via ORAL
  Filled 2016-04-22 (×6): qty 2

## 2016-04-22 MED ORDER — ENOXAPARIN SODIUM 40 MG/0.4ML ~~LOC~~ SOLN
40.0000 mg | Freq: Every day | SUBCUTANEOUS | Status: DC
  Administered 2016-04-22 – 2016-04-25 (×4): 40 mg via SUBCUTANEOUS
  Filled 2016-04-22 (×4): qty 0.4

## 2016-04-22 MED ORDER — OXYCODONE HCL 5 MG PO TABS
15.0000 mg | ORAL_TABLET | ORAL | Status: DC | PRN
  Administered 2016-04-22: 15 mg via ORAL
  Filled 2016-04-22: qty 3

## 2016-04-22 MED ORDER — KETOROLAC TROMETHAMINE 30 MG/ML IJ SOLN: 30.0000 mg | Freq: Four times a day (QID) | INTRAMUSCULAR | Status: DC | PRN

## 2016-04-22 MED FILL — Lidocaine HCl IV Inj 20 MG/ML: INTRAVENOUS | Qty: 5 | Status: AC

## 2016-04-22 MED FILL — Succinylcholine Chloride Inj 20 MG/ML: INTRAMUSCULAR | Qty: 6 | Status: AC

## 2016-04-22 MED FILL — Midazolam HCl Inj 2 MG/2ML (Base Equivalent): INTRAMUSCULAR | Qty: 2 | Status: AC

## 2016-04-22 MED FILL — Ondansetron HCl Inj 4 MG/2ML (2 MG/ML): INTRAMUSCULAR | Qty: 2 | Status: AC

## 2016-04-22 MED FILL — Fentanyl Citrate Preservative Free (PF) Inj 250 MCG/5ML: INTRAMUSCULAR | Qty: 10 | Status: AC

## 2016-04-22 MED FILL — Propofol IV Emul 200 MG/20ML (10 MG/ML): INTRAVENOUS | Qty: 20 | Status: AC

## 2016-04-22 NOTE — Progress Notes (Signed)
Physical Therapy Wound Treatment Patient Details  Name: TORRES HARDENBROOK MRN: [id number] Date of Birth: Mar 01, 1991  Today's Date: 04/22/2016 Time: 4098-1191 Time Calculation (min): 35 min  Subjective  Subjective: Pt agreeable to hydrotherapy after pain medication Patient and Family Stated Goals: heal wounds Date of Onset:  (PTA) Prior Treatments: I&D   Pain Score:  Pt premedicated prior to session. Pt reports minimal pain throughout session.   Wound Assessment  Wound / Incision (Open or Dehisced) 04/22/16 Incision - Open Arm Right Forearm (Active)  Dressing Type Compression wrap;Gauze (Comment);Moist to dry 04/22/2016  2:00 PM  Dressing Changed Changed 04/22/2016  2:00 PM  Dressing Status Clean;Dry;Intact 04/22/2016  2:00 PM  Dressing Change Frequency Daily 04/22/2016  2:00 PM  Site / Wound Assessment Red 04/22/2016  2:00 PM  % Wound base Red or Granulating 100% 04/22/2016  2:00 PM  % Wound base Yellow 0% 04/22/2016  2:00 PM  % Wound base Black 0% 04/22/2016  2:00 PM  % Wound base Other (Comment) 0% 04/22/2016  2:00 PM  Peri-wound Assessment Intact 04/22/2016  2:00 PM  Wound Length (cm) 9.5 cm 04/22/2016  2:00 PM  Wound Width (cm) 1.3 cm 04/22/2016  2:00 PM  Wound Depth (cm) 0.6 cm 04/22/2016  2:00 PM  Tunneling (cm) .2 at 7 o'clock 04/22/2016  2:00 PM  Margins Unattached edges (unapproximated) 04/22/2016  2:00 PM  Closure Sutures 04/22/2016  2:00 PM  Drainage Amount Moderate 04/22/2016  2:00 PM  Drainage Description Serous 04/22/2016  2:00 PM  Treatment Hydrotherapy (Pulse lavage);Packing (Saline gauze) 04/22/2016  2:00 PM   Hydrotherapy Pulsed lavage therapy - wound location: R arm Pulsed Lavage with Suction (psi): 8 psi (4 at times due to pain) Pulsed Lavage with Suction - Normal Saline Used: 1000 mL Pulsed Lavage Tip: Tip with splash shield   Wound Assessment and Plan  Wound Therapy - Assess/Plan/Recommendations Wound Therapy - Clinical Statement: Pt presents s/p removal of foreign  object from R arm and I&D. Pt will benefit from hydrotherapy to promote healing and decrease bioburden.  Wound Therapy - Functional Problem List: Decreased strength and AROM due to pain.  Factors Delaying/Impairing Wound Healing: Substance abuse Hydrotherapy Plan: Dressing change;Debridement;Patient/family education;Pulsatile lavage with suction Wound Therapy - Frequency: 6X / week Wound Therapy - Follow Up Recommendations: Home health RN Wound Plan: See above  Wound Therapy Goals- Improve the function of patient's integumentary system by progressing the wound(s) through the phases of wound healing (inflammation - proliferation - remodeling) by: Patient/Family will be able to : manage self-dressing changes Patient/Family Instruction Goal - Progress: Goal set today Goals/treatment plan/discharge plan were made with and agreed upon by patient/family: Yes Time For Goal Achievement: 7 days Wound Therapy - Potential for Goals: Excellent  Goals will be updated until maximal potential achieved or discharge criteria met.  Discharge criteria: when goals achieved, discharge from hospital, MD decision/surgical intervention, no progress towards goals, refusal/missing three consecutive treatments without notification or medical reason.  GP     Rolinda Roan 04/22/2016, 4:11 PM   Rolinda Roan, PT, DPT Acute Rehabilitation Services Pager: [phone]

## 2016-04-22 NOTE — Progress Notes (Signed)
PT Cancellation Note  Patient Details Name: Tony Walsh MRN: [id number] DOB: 7Boykin Nearing/25/1992   Cancelled Treatment:    Reason Eval/Treat Not Completed: Pain limiting ability to participate. Pt in increased pain at this time and appears very anxious. Discussed with RN who states that MD to come speak with pt regarding pain medication for hydro. Holding hydrotherapy at this time, and will check back this afternoon for pt readiness to participate.   Conni SlipperKirkman, Garrett Mitchum 04/22/2016, 11:38 AM   Conni SlipperLaura Faige Seely, PT, DPT Acute Rehabilitation Services Pager: [phone]

## 2016-04-22 NOTE — Progress Notes (Signed)
Patient ID: Tony Walsh, male   DOB: 1991-06-14, 25 y.o.   MRN: [id number] Patient is alert and oriented.  I removed his bandage and the Penrose drains.  Following this a perform bedside I and D  Will await culture results and then transition him to wet-to-dry dressing changes and by mouth antibiotics. He should not have antibiotics through his vein or a PICC line type apparatus once he leaves given his history of IV drug abuse. I prefer absolutely oral route only  Patient ID discussed his care plans and other issues.  His wound is looking much improved.  We'll have therapy see him today and begin hydrotherapy as well as teaching him wet-to-dry packings for the for window to areas which I packed today after his bedside I and D    Patient  carefully had the bandage removed without difficulty. Following this we performed a  irrigation and debridement.  Procedure note: Patient underwent thorough prep and sterile field application. Following this with combination knife blade scissor tip and curette we performed irrigation and debridement of skin and subcutaneous tissue. Deep tissue was also irrigated and removed. Patient tolerated this well. Copious  amounts of saline were placed in the wound. There is approximately a liter of irrigant applied to the wound. Once again this was an excisional debridement of skin subcutaneous tissue as well as associated deep tissue about the wound.  Following this the patient was packed with wet-to-dry gauze. We'll continue bedside irrigation and debridement and dressing changes in an aggressive fashion.  Patient tolerated the irrigation and debridement procedure without difficulty.  The patient understands the necessity of strict wound care antibiotics and other measures. Oftentimes wounds will take days to declare themselves. We'll do everything in our power to try and ensure uneventful healing. We have discussed with the patient the issues regarding their  infection to the extremity. We will continue antibiotics and await culture results. Often times it will take 3-5 days for cultures to become final. During this time we will typically have the patient on intravenous antibiotics until we can find a parenteral route of antibiotic regime specific for the bacteria or organism isolated. We have discussed with the patient the need for daily irrigation and debridement as well as therapy to the area. We have discussed with the patient the necessity of range of motion to the involved joints as discussed today. We have discussed with the patient the unpredictability of infections at times. We'll continue to work towards good pain control and restoration of function. The patient understands the need for meticulous wound care and the necessity of proper followup.  The possible complications of stiffness (loss of motion), resistant infection, possible deep bone infection, possible chronic pain issues, possible need for multiple surgeries and even amputation.  With this in mind the patient understands our goal is to eradicate the infection to quiesence. We will continue to work towards these goals.  Tawona Filsinger M.D.

## 2016-04-22 NOTE — Progress Notes (Signed)
Progress Note    Tony Walsh  ZOX:[id number] DOB: 16-Dec-1990  DOA: 04/21/2016 PCP: No PCP Per Patient    Brief Narrative:   Tony Walsh is an 25 y.o. male the PMH of IV drug abuse admitted for treatment of a right forearm/antecubital fossa abscess with a retained foreign body. Hand surgery subsequently consulted.  Assessment/Plan:   Principal Problem:   Abscess of right forearm Status post incision and drainage by Dr. Amanda Pea 04/20/16. Gram stain showing gram-positive cocci in pairs and gram-negative rods. Continue empiric vancomycin and Zosyn for now. Narrow based on culture data when finalized.Hydrotherapy ordered.  Active Problems:   IV drug abuse/hepatitis C positive Urine drug screen positive for opiates, cocaine and benzodiazepines. Discontinue IV narcotics. Use oral medications for pain control. HIV nonreactive. Hepatitis C antibody positive. Check viral load.Discussed case with Dr. Ninetta Lights who also recommends checking iron, PT/INR, ANA, and genotype. Patient continues to be IV narcotic seeking despite setting limits with only receiving IV narcotics prior to hydrotherapy sessions. He has been prescribed oral Dilaudid for pain control. Requested permission to leave the unit to go outside. Was told that this is too much of a liability and that we could not allow him to do this. RN has concerns that he may be using drugs in his bathroom.   Family Communication/Anticipated D/C date and plan/Code Status   DVT prophylaxis: Lovenox ordered. Code Status: Full Code.  Family Communication: No family currently at the bedside. Disposition Plan: Home when cleared by hand surgeon.   Medical Consultants:    Dr. Amanda Pea: Hand Surgery   Procedures:   PROCEDURE PERFORMED: 1. Irrigation and debridement of skin, subcutaneous tissue, muscle,  and tendon. Excisional debridement with curette, knife, blade, and  scissor. This was an excisional debridement. 2. Removal of  foreign body, right forearm, in the form of a needle  broken in nature off the syringe. 3. Fascial release and fasciotomy with fascial excision, right  forearm, dorsal aspect. 4. Radical extensor tenolysis, tenosynovectomy of the EDC and  __________ (APL and EPB) musculotendinous regions about the wrist  and forearm. 5. Evacuation of deep abscess, right forearm.  Anti-Infectives:   Zosyn 04/21/16---> Vancomycin 04/21/16--->  Subjective:   Tony Walsh is very opiate seeking and wants high doses of Dilaudid. Told me he is having severe pain in his right forearm and that it 5 don't give him IV pain medications he will leave AGAINST MEDICAL ADVICE and get medications off the street. Despite multiple attempts to explain the rationale for using oral pain medications to treat his pain, he continues to ask the same questions over and over again, displaying frankly manipulative behavior and attempting to be intimidating.  Objective:    Filed Vitals:   04/21/16 1600 04/21/16 1613 04/21/16 2106 04/22/16 0539  BP:  127/65 125/71 135/72  Pulse:  62 66 84  Temp:  98.3 F (36.8 C) 98.8 F (37.1 C) 98.8 F (37.1 C)  TempSrc:  Oral  Oral  Resp:  16 15 16   Height: 6\' 4"  (1.93 m)     Weight: 108.863 kg (240 lb)     SpO2:  99% 97% 98%    Intake/Output Summary (Last 24 hours) at 04/22/16 0800 Last data filed at 04/21/16 1700  Gross per 24 hour  Intake   1200 ml  Output   1000 ml  Net    200 ml   Filed Weights   04/21/16 1600  Weight: 108.863 kg (240 lb)  Exam: General exam: Anxious. Respiratory system: Clear to auscultation. Respiratory effort normal. Cardiovascular system: S1 & S2 heard, RRR. No JVD,  rubs, gallops or clicks. No murmurs. Gastrointestinal system: Abdomen is nondistended, soft and nontender. No organomegaly or masses felt. Normal bowel sounds heard. Central nervous system: Alert and oriented. No focal neurological deficits. Extremities: Left forearm  in dressings that are clean and intact. Skin: No rashes, lesions or ulcers. +Needle tracks. Psychiatry: Judgement and insight appear normal. Mood & affect appropriate.   Data Reviewed:   I have personally reviewed following labs and imaging studies:  Labs: Basic Metabolic Panel:  Recent Labs Lab 04/20/16 2145 04/21/16 0628 04/21/16 0812  NA 135 137 137  K 4.1 4.5 4.0  CL 103 101 99*  CO2 25 25 25   GLUCOSE 102* 79 111*  BUN 10 10 8   CREATININE 1.26* 1.18 1.24  CALCIUM 9.0 9.0 9.0   GFR Estimated Creatinine Clearance: 122.1 mL/min (by C-G formula based on Cr of 1.24). Liver Function Tests:  Recent Labs Lab 04/20/16 2145 04/21/16 0812  AST 15 23  ALT 13* 14*  ALKPHOS 36* 33*  BILITOT 0.5 1.0  PROT 7.3 6.7  ALBUMIN 3.7 3.3*   CBC:  Recent Labs Lab 04/20/16 2145 04/21/16 0628 04/21/16 0812  WBC 8.3 8.6 8.6  NEUTROABS 5.9  --  6.0  HGB 13.2 12.7* 12.6*  HCT 39.5 38.7* 39.0  MCV 84.0 84.5 84.6  PLT 177 169 156   Urine analysis:    Component Value Date/Time   COLORURINE YELLOW 04/21/2016 0820   APPEARANCEUR CLEAR 04/21/2016 0820   LABSPEC 1.018 04/21/2016 0820   PHURINE 6.5 04/21/2016 0820   GLUCOSEU NEGATIVE 04/21/2016 0820   HGBUR NEGATIVE 04/21/2016 0820   BILIRUBINUR NEGATIVE 04/21/2016 0820   KETONESUR NEGATIVE 04/21/2016 0820   PROTEINUR NEGATIVE 04/21/2016 0820   UROBILINOGEN 0.2 11/10/2010 0913   NITRITE NEGATIVE 04/21/2016 0820   LEUKOCYTESUR NEGATIVE 04/21/2016 0820   Microbiology Recent Results (from the past 240 hour(s))  Anaerobic culture     Status: None (Preliminary result)   Collection Time: 04/21/16  4:01 AM  Result Value Ref Range Status   Specimen Description ABSCESS  Final   Special Requests RIGHT FOREARM ABSCESS  Final   Gram Stain   Final    ABUNDANT WBC PRESENT, PREDOMINANTLY PMN NO SQUAMOUS EPITHELIAL CELLS SEEN ABUNDANT GRAM POSITIVE COCCI IN PAIRS ABUNDANT GRAM NEGATIVE RODS [facility]formed at [facility]     Culture PENDING  Incomplete   Report Status PENDING  Incomplete  Culture, routine-abscess     Status: None (Preliminary result)   Collection Time: 04/21/16  4:01 AM  Result Value Ref Range Status   Specimen Description ABSCESS  Final   Special Requests RIGHT FOREARM ABSCESS  Final   Gram Stain   Final    ABUNDANT WBC PRESENT, PREDOMINANTLY PMN NO SQUAMOUS EPITHELIAL CELLS SEEN ABUNDANT GRAM POSITIVE COCCI IN PAIRS ABUNDANT GRAM NEGATIVE RODS Performed at [facility]    Culture PENDING  Incomplete   Report Status PENDING  Incomplete    Radiology: Dg Forearm Right  04/20/2016  CLINICAL DATA:  The patient thinks he broke a needle in right arm. EXAM: RIGHT FOREARM - 2 VIEW COMPARISON:  None. FINDINGS: There is a 1.3 cm radiopaque needle in the lateral soft tissues of the right forearm with associated soft tissue swelling. The bones are normal. IMPRESSION: There is a 1.3 cm radiopaque needle in the lateral soft tissues of the right forearm with associated  soft tissue swelling. Electronically Signed   By: Sherian Rein M.D.   On: 04/20/2016 21:26    Medications:   . bisacodyl  5 mg Oral Daily  . docusate sodium  100 mg Oral BID  . heparin subcutaneous  5,000 Units Subcutaneous Q8H  . piperacillin-tazobactam (ZOSYN)  IV  3.375 g Intravenous Q8H  . polyethylene glycol  17 g Oral Daily  . vancomycin  750 mg Intravenous Q12H   Continuous Infusions:   Time spent: 35 minutes. The patient required an extended time to answer his questions and reiterate answers over and over again.   LOS: 1 day   Linley Moxley  [facility] Pager [phone]. If unable to reach me by pager, please call my cell phone at [phone].  *Please refer to [email or web address], password [email or web address] to get updated schedule on who will round on this patient, as hospitalists switch teams weekly. If 7PM-7AM, please contact night-coverage at [email or web address], password [email or web address] for any overnight needs.  04/22/2016, 8:00 AM

## 2016-04-23 ENCOUNTER — Inpatient Hospital Stay (HOSPITAL_COMMUNITY): Payer: Medicaid Other

## 2016-04-23 ENCOUNTER — Other Ambulatory Visit (HOSPITAL_COMMUNITY): Payer: Self-pay

## 2016-04-23 DIAGNOSIS — I509 Heart failure, unspecified: Secondary | ICD-10-CM

## 2016-04-23 LAB — ECHOCARDIOGRAM COMPLETE
HEIGHTINCHES: 76 in
Weight: 3840 oz

## 2016-04-23 LAB — HCV RNA QUANT
HCV Quantitative Log: 5.286 log10 IU/mL (ref >1.70)
HCV Quantitative: 193000 IU/mL (ref >50)

## 2016-04-23 LAB — ANTINUCLEAR ANTIBODIES, IFA: ANTINUCLEAR ANTIBODIES, IFA: NEGATIVE

## 2016-04-23 MED ORDER — HYDROMORPHONE HCL 2 MG PO TABS
6.0000 mg | ORAL_TABLET | ORAL | Status: DC | PRN
  Administered 2016-04-23 – 2016-04-25 (×15): 6 mg via ORAL
  Filled 2016-04-23 (×15): qty 3

## 2016-04-23 MED ORDER — LORAZEPAM 1 MG PO TABS
1.0000 mg | ORAL_TABLET | Freq: Every day | ORAL | Status: DC
  Administered 2016-04-23 – 2016-04-24 (×2): 1 mg via ORAL
  Filled 2016-04-23 (×2): qty 1

## 2016-04-23 NOTE — Progress Notes (Signed)
Progress Note    Tony Walsh  WUJ:[id number] DOB: [birth year]  DOA: 04/21/2016 PCP: No PCP Per Patient    Brief Narrative:   Tony Walsh is an 25 y.o. male the PMH of IV drug abuse admitted for treatment of a right forearm/antecubital fossa abscess with a retained foreign body. Hand surgery subsequently consulted.  Assessment/Plan:   Principal Problem:   Abscess of right forearm Status post incision and drainage by Dr. Amanda Pea 04/20/16. Gram stain showing gram-positive cocci in pairs and gram-negative rods.Cultures pending. Continue empiric vancomycin and Zosyn for now. Narrow based on culture data when finalized.Hydrotherapy ordered. Patient continues to complain of pain at the operative site. I have adjusted his oral pain medications.  Active Problems:   IV drug abuse/hepatitis C positive Urine drug screen positive for opiates, cocaine and benzodiazepines. Discontinue IV narcotics. Use oral medications for pain control. HIV nonreactive. Hepatitis C antibody positive. Check viral load.Discussed case with Dr. Ninetta Lights who also recommends checking iron, PT/INR, ANA, and genotype. Ferritin 155, ANA negative, viral load 193,000 with a log of 5.286, other studies pending. Will need outpatient ID follow-up. Patient continues to be IV narcotic seeking despite setting limits with only receiving IV narcotics prior to hydrotherapy sessions. He has been prescribed oral Dilaudid for pain control, which has been adjusted.  Family Communication/Anticipated D/C date and plan/Code Status   DVT prophylaxis: Lovenox ordered. Code Status: Full Code.  Family Communication: Wife at the bedside. Disposition Plan: Home when cleared by hand surgeon.   Medical Consultants:    Dr. Amanda Pea: Hand Surgery   Procedures:   Surgery 04/21/16 PROCEDURE PERFORMED: 1. Irrigation and debridement of skin, subcutaneous tissue, muscle,  and tendon. Excisional debridement with curette, knife, blade,  and  scissor. This was an excisional debridement. 2. Removal of foreign body, right forearm, in the form of a needle  broken in nature off the syringe. 3. Fascial release and fasciotomy with fascial excision, right  forearm, dorsal aspect. 4. Radical extensor tenolysis, tenosynovectomy of the EDC and  __________ (APL and EPB) musculotendinous regions about the wrist  and forearm. 5. Evacuation of deep abscess, right forearm.  2-D echo 04/23/16  Impressions:  - Normal LV systolic and diastolic function; mild LAE; trace MR and TR.  Anti-Infectives:   Zosyn 04/21/16---> Vancomycin 04/21/16--->  Subjective:   Tony Walsh continues to be very manipulative and opiate seeking. Tells me he wants IV narcotics for pain control and that this works well for him, although he and knowledge is that his pain is under reasonably good control with oral medications, but says that it only lasts about an hour and a half. Appears high. Wife is at the bedside but is completely disengaged.  Objective:    Filed Vitals:   04/22/16 0539 04/22/16 1300 04/22/16 2011 04/23/16 0424  BP: 135/72 137/69 136/66 119/58  Pulse: 84 64 72 57  Temp: 98.8 F (37.1 C) 98 F (36.7 C) 98.3 F (36.8 C) 98.2 F (36.8 C)  TempSrc: Oral Oral Oral Oral  Resp: Height:      Weight:      SpO2: 98% 100% 100% 99%    Intake/Output Summary (Last 24 hours) at 04/23/16 0833 Last data filed at 04/23/16 0700  Gross per 24 hour  Intake 1159.5 ml  Output   1301 ml  Net -141.5 ml   Filed Weights   04/21/16 1600  Weight: 108.863 kg (240 lb)    Exam: General  exam: Calm today. Respiratory system: Clear to auscultation. Respiratory effort normal. Cardiovascular system: S1 & S2 heard, RRR. No JVD,  rubs, gallops or clicks. No murmurs. Gastrointestinal system: Abdomen is nondistended, soft and nontender. No organomegaly or masses felt. Normal bowel sounds heard. Central nervous system:  Alert and oriented. No focal neurological deficits. Extremities: Left forearm in dressings that are clean and intact. Skin: No rashes, lesions or ulcers. +Needle tracks. Psychiatry: Judgement and insight appear diminished. Appears high.   Data Reviewed:   I have personally reviewed following labs and imaging studies:  Labs: Basic Metabolic Panel:  Recent Labs Lab 04/20/16 2145 04/21/16 0628 04/21/16 0812 04/22/16 0620  NA 135 137 137 135  K 4.1 4.5 4.0 4.4  CL 103 101 99* 96*  CO2 25 25 25 27   GLUCOSE 102* 79 111* 79  BUN 10 10 8 7   CREATININE 1.26* 1.18 1.24 1.33*  CALCIUM 9.0 9.0 9.0 8.9   GFR Estimated Creatinine Clearance: 113.8 mL/min (by C-G formula based on Cr of 1.33). Liver Function Tests:  Recent Labs Lab 04/20/16 2145 04/21/16 0812  AST 15 23  ALT 13* 14*  ALKPHOS 36* 33*  BILITOT 0.5 1.0  PROT 7.3 6.7  ALBUMIN 3.7 3.3*   CBC:  Recent Labs Lab 04/20/16 2145 04/21/16 0628 04/21/16 0812 04/22/16 0620  WBC 8.3 8.6 8.6 5.4  NEUTROABS 5.9  --  6.0  --   HGB 13.2 12.7* 12.6* 12.6*  HCT 39.5 38.7* 39.0 37.2*  MCV 84.0 84.5 84.6 85.3  PLT 177 169 156 174   Urine analysis:    Component Value Date/Time   COLORURINE YELLOW 04/21/2016 0820   APPEARANCEUR CLEAR 04/21/2016 0820   LABSPEC 1.018 04/21/2016 0820   PHURINE 6.5 04/21/2016 0820   GLUCOSEU NEGATIVE 04/21/2016 0820   HGBUR NEGATIVE 04/21/2016 0820   BILIRUBINUR NEGATIVE 04/21/2016 0820   KETONESUR NEGATIVE 04/21/2016 0820   PROTEINUR NEGATIVE 04/21/2016 0820   UROBILINOGEN 0.2 11/10/2010 0913   NITRITE NEGATIVE 04/21/2016 0820   LEUKOCYTESUR NEGATIVE 04/21/2016 0820   Microbiology Recent Results (from the past 240 hour(s))  Culture, blood (Routine X 2) w Reflex to ID Panel     Status: None (Preliminary result)   Collection Time: 04/21/16  2:12 AM  Result Value Ref Range Status   Specimen Description BLOOD RIGHT HAND  Final   Special Requests IN PEDIATRIC BOTTLE 5ML  Final   Culture  NO GROWTH 1 DAY  Final   Report Status PENDING  Incomplete  Culture, blood (Routine X 2) w Reflex to ID Panel     Status: None (Preliminary result)   Collection Time: 04/21/16  2:20 AM  Result Value Ref Range Status   Specimen Description BLOOD LEFT HAND  Final   Special Requests BOTTLES DRAWN AEROBIC AND ANAEROBIC 5ML  Final   Culture NO GROWTH 1 DAY  Final   Report Status PENDING  Incomplete  Anaerobic culture     Status: None (Preliminary result)   Collection Time: 04/21/16  4:01 AM  Result Value Ref Range Status   Specimen Description ABSCESS  Final   Special Requests RIGHT FOREARM ABSCESS  Final   Gram Stain   Final    ABUNDANT WBC PRESENT, PREDOMINANTLY PMN NO SQUAMOUS EPITHELIAL CELLS SEEN ABUNDANT GRAM POSITIVE COCCI IN PAIRS ABUNDANT GRAM NEGATIVE RODS Performed at [facility]    Culture PENDING  Incomplete   Report Status PENDING  Incomplete  Culture, routine-abscess     Status: None (Preliminary result)  Collection Time: 04/21/16  4:01 AM  Result Value Ref Range Status   Specimen Description ABSCESS  Final   Special Requests RIGHT FOREARM ABSCESS  Final   Gram Stain   Final    ABUNDANT WBC PRESENT, PREDOMINANTLY PMN NO SQUAMOUS EPITHELIAL CELLS SEEN ABUNDANT GRAM POSITIVE COCCI IN PAIRS ABUNDANT GRAM NEGATIVE RODS Performed at [facility]    Culture PENDING  Incomplete   Report Status PENDING  Incomplete  Tissue culture     Status: None (Preliminary result)   Collection Time: 04/21/16  4:01 AM  Result Value Ref Range Status   Specimen Description TISSUE  Final   Special Requests RIGHT FOREARM  Final   Gram Stain   Final    RARE WBC PRESENT, PREDOMINANTLY PMN NO ORGANISMS SEEN Performed at [facility]    Culture   Final    NO GROWTH 1 DAY Performed at [facility]    Report Status PENDING  Incomplete    Radiology: No results found.  Medications:   . bisacodyl  5 mg Oral Daily  . docusate sodium  100 mg Oral BID   . enoxaparin (LOVENOX) injection  40 mg Subcutaneous Daily  . piperacillin-tazobactam (ZOSYN)  IV  3.375 g Intravenous Q8H  . polyethylene glycol  17 g Oral Daily  . vancomycin  750 mg Intravenous Q12H   Continuous Infusions:   Time spent: 25 minutes.   LOS: 2 days   RAMA,CHRISTINA  [facility] Pager [phone]. If unable to reach me by pager, please call my cell phone at [phone].  *Please refer to [email or web address], password [email or web address] to get updated schedule on who will round on this patient, as hospitalists switch teams weekly. If 7PM-7AM, please contact night-coverage at [email or web address], password [email or web address] for any overnight needs.  04/23/2016, 8:33 AM

## 2016-04-23 NOTE — Progress Notes (Signed)
Physical Therapy Wound Treatment Patient Details  Name: Tony Walsh MRN: [id number] Date of Birth: 12/11/1990  Today's Date: 04/23/2016 Time: 0900-0940 Time Calculation (min): 40 min  Subjective  Subjective: Pt agreeable to hydrotherapy after pain medication Patient and Family Stated Goals: heal wounds Date of Onset:  (PTA) Prior Treatments: I&D   Pain Score: Pt premedicated prior to session beginning.    Wound Assessment  Wound / Incision (Open or Dehisced) 04/22/16 Incision - Open Arm Right Forearm (Active)  Dressing Type Compression wrap;Gauze (Comment);Moist to dry 04/23/2016 12:36 PM  Dressing Changed Changed 04/23/2016 12:36 PM  Dressing Status Clean;Dry;Intact 04/23/2016 12:36 PM  Dressing Change Frequency Daily 04/23/2016 12:36 PM  Site / Wound Assessment Red 04/23/2016 12:36 PM  % Wound base Red or Granulating 100% 04/23/2016 12:36 PM  % Wound base Yellow 0% 04/23/2016 12:36 PM  % Wound base Black 0% 04/23/2016 12:36 PM  % Wound base Other (Comment) 0% 04/23/2016 12:36 PM  Peri-wound Assessment Intact 04/23/2016 12:36 PM  Wound Length (cm) 9.5 cm 04/22/2016  2:00 PM  Wound Width (cm) 1.3 cm 04/22/2016  2:00 PM  Wound Depth (cm) 0.6 cm 04/22/2016  2:00 PM  Tunneling (cm) .2 at 7 o'clock 04/22/2016  2:00 PM  Margins Unattached edges (unapproximated) 04/23/2016 12:36 PM  Closure Sutures 04/23/2016 12:36 PM  Drainage Amount Moderate 04/23/2016 12:36 PM  Drainage Description Serous 04/23/2016 12:36 PM  Treatment Hydrotherapy (Pulse lavage) 04/23/2016 12:36 PM     Incision (Closed) 04/21/16 Arm Right (Active)  Dressing Type Compression wrap 04/23/2016  8:00 AM  Dressing Intact 04/23/2016  8:00 AM  Site / Wound Assessment Dressing in place / Unable to assess 04/23/2016  8:00 AM  Drainage Amount Minimal 04/23/2016  8:00 AM  Drainage Description Serosanguineous 04/23/2016  8:00 AM  Treatment Ice applied 04/23/2016  8:00 AM   Hydrotherapy Pulsed lavage therapy - wound location: R arm Pulsed  Lavage with Suction (psi): 8 psi (4 at times due to pain) Pulsed Lavage with Suction - Normal Saline Used: 1000 mL Pulsed Lavage Tip: Tip with splash shield   Wound Assessment and Plan  Wound Therapy - Assess/Plan/Recommendations Wound Therapy - Clinical Statement: Pt will benefit from continued hydrotherapy to promote healing and decrease bioburden.  Wound Therapy - Functional Problem List: Decreased strength due to pain.  Factors Delaying/Impairing Wound Healing: Substance abuse Hydrotherapy Plan: Dressing change;Debridement;Patient/family education;Pulsatile lavage with suction Wound Therapy - Frequency: 6X / week Wound Therapy - Follow Up Recommendations: Home health RN Wound Plan: See above  Wound Therapy Goals- Improve the function of patient's integumentary system by progressing the wound(s) through the phases of wound healing (inflammation - proliferation - remodeling) by: Patient/Family will be able to : manage self-dressing changes Patient/Family Instruction Goal - Progress: Progressing toward goal Goals/treatment plan/discharge plan were made with and agreed upon by patient/family: Yes Time For Goal Achievement: 7 days Wound Therapy - Potential for Goals: Excellent  Goals will be updated until maximal potential achieved or discharge criteria met.  Discharge criteria: when goals achieved, discharge from hospital, MD decision/surgical intervention, no progress towards goals, refusal/missing three consecutive treatments without notification or medical reason.  GP     Rolinda Roan 04/23/2016, 12:40 PM   Rolinda Roan, PT, DPT [facility] Pager: [phone]

## 2016-04-23 NOTE — Progress Notes (Signed)
  Echocardiogram 2D Echocardiogram has been performed.  Tony Walsh 04/23/2016, 3:08 PM

## 2016-04-24 LAB — TISSUE CULTURE: Culture: NO GROWTH

## 2016-04-24 LAB — HEPATITIS C GENOTYPE: HCV Genotype: 3

## 2016-04-24 LAB — BASIC METABOLIC PANEL
ANION GAP: 9 (ref 5–15)
BUN: 10 mg/dL (ref 6–20)
CALCIUM: 9.2 mg/dL (ref 8.9–10.3)
CO2: 26 mmol/L (ref 22–32)
Chloride: 103 mmol/L (ref 101–111)
Creatinine, Ser: 1.01 mg/dL (ref 0.61–1.24)
Glucose, Bld: 117 mg/dL — ABNORMAL HIGH (ref 65–99)
Potassium: 4.5 mmol/L (ref 3.5–5.1)
Sodium: 138 mmol/L (ref 135–145)

## 2016-04-24 LAB — VANCOMYCIN, TROUGH: VANCOMYCIN TR: 6 ug/mL — AB (ref 10.0–20.0)

## 2016-04-24 MED ORDER — HYDROMORPHONE HCL 1 MG/ML IJ SOLN
2.0000 mg | Freq: Once | INTRAMUSCULAR | Status: AC
  Administered 2016-04-24: 2 mg via INTRAVENOUS
  Filled 2016-04-24: qty 2

## 2016-04-24 MED ORDER — HYDROMORPHONE HCL 2 MG/ML IJ SOLN: 4.0000 mg | Freq: Once | INTRAMUSCULAR | Status: DC

## 2016-04-24 MED ORDER — VANCOMYCIN HCL IN DEXTROSE 1-5 GM/200ML-% IV SOLN
1000.0000 mg | Freq: Three times a day (TID) | INTRAVENOUS | Status: DC
  Administered 2016-04-24 – 2016-04-26 (×5): 1000 mg via INTRAVENOUS
  Filled 2016-04-24 (×8): qty 200

## 2016-04-24 MED ORDER — HYDROMORPHONE HCL 1 MG/ML IJ SOLN
2.0000 mg | Freq: Every day | INTRAMUSCULAR | Status: DC
  Administered 2016-04-24: 2 mg via INTRAVENOUS
  Filled 2016-04-24: qty 2

## 2016-04-24 NOTE — Progress Notes (Signed)
Physical Therapy Wound Treatment Patient Details  Name: Tony Walsh MRN: [id number] Date of Birth: [birth year]  Today's Date: 04/24/2016 Time: 6270-3500 Time Calculation (min): 34 min  Subjective  Subjective: Pt agreeable to hydrotherapy after pain medication Patient and Family Stated Goals: heal wounds and save my arm Date of Onset:  (PTA) Prior Treatments: I&D   Pain Score: Pain Score: 7 overall; 10 during deep packing  Wound Assessment   Clinical Statement: noted deep pocket (~4 cim) in most proximal area of wound; difficult to pack due to very narrow. Patient has continued with copious drainage and induration at this area. Continues to benefit from hydrotherapy due to degree of infection. Patient reports nursing has been changing dressing evey evening due to copious drainage. RN notified re: deep pocket for packing.  Wound / Incision (Open or Dehisced) 04/22/16 Incision - Open Arm Right Forearm (Active)  Dressing Type Compression wrap;Gauze (Comment);Moist to dry;ABD 04/24/2016  8:44 AM  Dressing Changed Changed 04/24/2016  8:44 AM  Dressing Status Clean;Dry;Intact 04/24/2016  8:44 AM  Dressing Change Frequency Daily 04/24/2016  8:44 AM  Site / Wound Assessment Red;Yellow 04/24/2016  8:44 AM  % Wound base Red or Granulating 95% 04/24/2016  8:44 AM  % Wound base Yellow 5% 04/24/2016  8:44 AM  % Wound base Black 0% 04/24/2016  8:44 AM  % Wound base Other (Comment) 0% 04/24/2016  8:44 AM  Peri-wound Assessment Intact;Induration;Edema 04/24/2016  8:44 AM  Wound Length (cm) 9.5 cm 04/22/2016  2:00 PM  Wound Width (cm) 1.3 cm 04/22/2016  2:00 PM  Wound Depth (cm) 0.6 cm 04/22/2016  2:00 PM  Tunneling (cm) .2 at 7 o'clock 04/22/2016  2:00 PM  Margins Unattached edges (unapproximated) 04/24/2016  8:44 AM  Closure Sutures 04/24/2016  8:44 AM  Drainage Amount Copious 04/24/2016  8:44 AM  Drainage Description Serosanguineous;No odor 04/24/2016  8:44 AM  Non-staged Wound Description Full thickness  04/24/2016  8:44 AM  Treatment Hydrotherapy (Pulse lavage);Packing (Saline gauze) 04/24/2016  8:44 AM       04/24/2016 Barry Brunner, PT Pager: [phone]   Hydrotherapy Pulsed lavage therapy - wound location: R arm Pulsed Lavage with Suction (psi): 4 psi (majority red tissue) Pulsed Lavage with Suction - Normal Saline Used: 1000 mL Pulsed Lavage Tip: Tip with splash shield   Wound Assessment and Plan  Wound Therapy - Assess/Plan/Recommendations Wound Therapy - Clinical Statement: noted deep pocket in most proximal area of wound; difficult to pack due to very narrow. Patient has continued with copious drainage and induration at this area. Continues to benefit from hydrotherapy due to degree of infection Wound Therapy - Functional Problem List: Decreased strength due to pain.  Factors Delaying/Impairing Wound Healing: Substance abuse Hydrotherapy Plan: Dressing change;Debridement;Patient/family education;Pulsatile lavage with suction Wound Therapy - Frequency: 6X / week Wound Therapy - Follow Up Recommendations: Home health RN Wound Plan: See above  Wound Therapy Goals- Improve the function of patient's integumentary system by progressing the wound(s) through the phases of wound healing (inflammation - proliferation - remodeling) by: Patient/Family will be able to : manage self-dressing changes Patient/Family Instruction Goal - Progress: Progressing toward goal Additional Wound Therapy Goal: Demonstrate proper positioning and UE exercises to reduce edema Additional Wound Therapy Goal - Progress: Goal set today Goals/treatment plan/discharge plan were made with and agreed upon by patient/family: Yes Time For Goal Achievement: 7 days Wound Therapy - Potential for Goals: Excellent  Goals will be updated until maximal potential achieved or discharge criteria met.  Discharge  criteria: when goals achieved, discharge from hospital, MD decision/surgical intervention, no progress towards goals,  refusal/missing three consecutive treatments without notification or medical reason.  GP     Tony Walsh 04/24/2016, 8:57 AM Pager [phone]

## 2016-04-24 NOTE — Progress Notes (Signed)
Progress Note    Tony Walsh  BJY:[id number] DOB: 26-Aug-1991  DOA: 04/21/2016 PCP: No PCP Per Patient    Brief Narrative:   Tony Walsh is an 25 y.o. male the PMH of IV drug abuse admitted for treatment of a right forearm/antecubital fossa abscess with a retained foreign body. Hand surgery subsequently consulted.  Assessment/Plan:   Principal Problem:   Abscess of right forearm Status post incision and drainage by Dr. Amanda Pea 04/20/16. Gram stain showing gram-positive cocci in pairs and gram-negative rods. Abscess cultures still pending but tissue cultures were negative. Continue empiric vancomycin and Zosyn for now. Narrow based on culture data when finalized. Continue hydrotherapy.   Active Problems:   IV drug abuse/hepatitis C positive Urine drug screen positive for opiates, cocaine and benzodiazepines. Discontinue IV narcotics. Use oral medications for pain control. HIV nonreactive. Hepatitis C antibody positive. Check viral load.Discussed case with Dr. Ninetta Lights who also recommends checking iron, PT/INR, ANA, and genotype. Ferritin 155, PT 14/INR 1.06, ANA negative, viral load 193,000 with a log of 5.286, genotype pending. Will need outpatient ID follow-up. Patient continues to be IV narcotic seeking despite setting limits with only receiving IV narcotics prior to hydrotherapy sessions and a dose at bedtime in lieu of oral dose so that he can rest. He has been prescribed oral Dilaudid for pain control, which has been adjusted.  Family Communication/Anticipated D/C date and plan/Code Status   DVT prophylaxis: Lovenox ordered. Code Status: Full Code.  Family Communication: Wife at the bedside 04/22/16. Disposition Plan: Home when cleared by hand surgeon.   Medical Consultants:    Dr. Amanda Pea: Hand Surgery  Procedures:   Surgery 04/21/16 PROCEDURE PERFORMED: 1. Irrigation and debridement of skin, subcutaneous tissue, muscle,  and tendon. Excisional debridement  with curette, knife, blade, and  scissor. This was an excisional debridement. 2. Removal of foreign body, right forearm, in the form of a needle  broken in nature off the syringe. 3. Fascial release and fasciotomy with fascial excision, right  forearm, dorsal aspect. 4. Radical extensor tenolysis, tenosynovectomy of the EDC and  __________ (APL and EPB) musculotendinous regions about the wrist  and forearm. 5. Evacuation of deep abscess, right forearm.  2-D echo 04/23/16  Impressions:  - Normal LV systolic and diastolic function; mild LAE; trace MR and TR.  Anti-Infectives:   Zosyn 04/21/16---> Vancomycin 04/21/16--->  Subjective:   Tony Walsh continues to be very manipulative and opiate seeking. Tells me he has uncontrolled pain due to the therapist packing his wound too deep, and now feels like his arm his tight. Recorded a video of his hydrotherapy session today with him screaming out as his wounds were packed and played it for me. RN reports that he is up and about, walking in the halls, eating OK, without signs of discomfort.  Objective:    Filed Vitals:   04/23/16 0424 04/23/16 1229 04/23/16 2207 04/24/16 0643  BP: 119/58 161/89 140/73 131/89  Pulse: 57 68 79 81  Temp: 98.2 F (36.8 C) 98.5 F (36.9 C) 97.7 F (36.5 C) 98 F (36.7 C)  TempSrc: Oral Oral Oral Oral  Resp: 16 16 18 16   Height:      Weight:      SpO2: 99% 99% 97% 100%    Intake/Output Summary (Last 24 hours) at 04/24/16 0834 Last data filed at 04/23/16 1700  Gross per 24 hour  Intake    480 ml  Output      0  ml  Net    480 ml   Filed Weights   04/21/16 1600  Weight: 108.863 kg (240 lb)    Exam: General exam: Calm today. Respiratory system: Clear to auscultation. Respiratory effort normal. Cardiovascular system: S1 & S2 heard, RRR. No JVD,  rubs, gallops or clicks. No murmurs. Gastrointestinal system: Abdomen is nondistended, soft and nontender. No organomegaly or  masses felt. Normal bowel sounds heard. Central nervous system: Alert and oriented. No focal neurological deficits. Extremities: Left forearm in dressings that are clean and intact. Skin: No rashes, lesions or ulcers. +Needle tracks. Psychiatry: Judgement and insight appear diminished.   Data Reviewed:   I have personally reviewed following labs and imaging studies:  Labs: Basic Metabolic Panel:  Recent Labs Lab 04/20/16 2145 04/21/16 0628 04/21/16 0812 04/22/16 0620  NA 135 137 137 135  K 4.1 4.5 4.0 4.4  CL 103 101 99* 96*  CO2 25 25 25 27   GLUCOSE 102* 79 111* 79  BUN 10 10 8 7   CREATININE 1.26* 1.18 1.24 1.33*  CALCIUM 9.0 9.0 9.0 8.9   GFR Estimated Creatinine Clearance: 113.8 mL/min (by C-G formula based on Cr of 1.33). Liver Function Tests:  Recent Labs Lab 04/20/16 2145 04/21/16 0812  AST 15 23  ALT 13* 14*  ALKPHOS 36* 33*  BILITOT 0.5 1.0  PROT 7.3 6.7  ALBUMIN 3.7 3.3*   CBC:  Recent Labs Lab 04/20/16 2145 04/21/16 0628 04/21/16 0812 04/22/16 0620  WBC 8.3 8.6 8.6 5.4  NEUTROABS 5.9  --  6.0  --   HGB 13.2 12.7* 12.6* 12.6*  HCT 39.5 38.7* 39.0 37.2*  MCV 84.0 84.5 84.6 85.3  PLT 177 169 156 174   Urine analysis:    Component Value Date/Time   COLORURINE YELLOW 04/21/2016 0820   APPEARANCEUR CLEAR 04/21/2016 0820   LABSPEC 1.018 04/21/2016 0820   PHURINE 6.5 04/21/2016 0820   GLUCOSEU NEGATIVE 04/21/2016 0820   HGBUR NEGATIVE 04/21/2016 0820   BILIRUBINUR NEGATIVE 04/21/2016 0820   KETONESUR NEGATIVE 04/21/2016 0820   PROTEINUR NEGATIVE 04/21/2016 0820   UROBILINOGEN 0.2 11/10/2010 0913   NITRITE NEGATIVE 04/21/2016 0820   LEUKOCYTESUR NEGATIVE 04/21/2016 0820   Microbiology Recent Results (from the past 240 hour(s))  Culture, blood (Routine X 2) w Reflex to ID Panel     Status: None (Preliminary result)   Collection Time: 04/21/16  2:12 AM  Result Value Ref Range Status   Specimen Description BLOOD RIGHT HAND  Final   Special  Requests IN PEDIATRIC BOTTLE 5ML  Final   Culture NO GROWTH 2 DAYS  Final   Report Status PENDING  Incomplete  Culture, blood (Routine X 2) w Reflex to ID Panel     Status: None (Preliminary result)   Collection Time: 04/21/16  2:20 AM  Result Value Ref Range Status   Specimen Description BLOOD LEFT HAND  Final   Special Requests BOTTLES DRAWN AEROBIC AND ANAEROBIC 5ML  Final   Culture NO GROWTH 2 DAYS  Final   Report Status PENDING  Incomplete  Anaerobic culture     Status: None (Preliminary result)   Collection Time: 04/21/16  4:01 AM  Result Value Ref Range Status   Specimen Description ABSCESS  Final   Special Requests RIGHT FOREARM ABSCESS  Final   Gram Stain   Final    ABUNDANT WBC PRESENT, PREDOMINANTLY PMN NO SQUAMOUS EPITHELIAL CELLS SEEN ABUNDANT GRAM POSITIVE COCCI IN PAIRS ABUNDANT GRAM NEGATIVE RODS Performed at [facility]  Culture   Final    NO ANAEROBES ISOLATED; CULTURE IN PROGRESS FOR 5 DAYS Performed at [facility]    Report Status PENDING  Incomplete  Culture, routine-abscess     Status: None (Preliminary result)   Collection Time: 04/21/16  4:01 AM  Result Value Ref Range Status   Specimen Description ABSCESS  Final   Special Requests RIGHT FOREARM ABSCESS  Final   Gram Stain   Final    ABUNDANT WBC PRESENT, PREDOMINANTLY PMN NO SQUAMOUS EPITHELIAL CELLS SEEN ABUNDANT GRAM POSITIVE COCCI IN PAIRS ABUNDANT GRAM NEGATIVE RODS Performed at [facility]    Culture   Final    Culture reincubated for better growth Performed at [facility]    Report Status PENDING  Incomplete  Tissue culture     Status: None   Collection Time: 04/21/16  4:01 AM  Result Value Ref Range Status   Specimen Description TISSUE  Final   Special Requests RIGHT FOREARM  Final   Gram Stain   Final    RARE WBC PRESENT, PREDOMINANTLY PMN NO ORGANISMS SEEN Performed at [facility]    Culture   Final    NO GROWTH 2 DAYS Performed at  [facility]    Report Status 04/24/2016 FINAL  Final    Radiology: No results found.  Medications:   . bisacodyl  5 mg Oral Daily  . docusate sodium  100 mg Oral BID  . enoxaparin (LOVENOX) injection  40 mg Subcutaneous Daily  . LORazepam  1 mg Oral QHS  . piperacillin-tazobactam (ZOSYN)  IV  3.375 g Intravenous Q8H  . polyethylene glycol  17 g Oral Daily  . vancomycin  750 mg Intravenous Q12H   Continuous Infusions:   Time spent: 25 minutes.   LOS: 3 days   RAMA,CHRISTINA  [facility] Pager [phone]. If unable to reach me by pager, please call my cell phone at [phone].  *Please refer to [email or web address], password [email or web address] to get updated schedule on who will round on this patient, as hospitalists switch teams weekly. If 7PM-7AM, please contact night-coverage at [email or web address], password [email or web address] for any overnight needs.  04/24/2016, 8:34 AM

## 2016-04-24 NOTE — Progress Notes (Signed)
Subjective: 3 Days Post-Op Procedure(s) (LRB): IRRIGATION AND DEBRIDEMENT EXTREMITY (Right) REMOVAL FOREIGN BODY EXTREMITY (Right) Patient reports pain as mild.    Objective: Vital signs in last 24 hours: Temp:  [97.7 F (36.5 C)-98.5 F (36.9 C)] 98 F (36.7 C) (05/20 0643) Pulse Rate:  [68-81] 81 (05/20 0643) Resp:  [16-18] 16 (05/20 0643) BP: (131-161)/(73-89) 131/89 mmHg (05/20 0643) SpO2:  [97 %-100 %] 100 % (05/20 0643)  Intake/Output from previous day: 05/19 0701 - 05/20 0700 In: 480 [P.O.:480] Out: -  Intake/Output this shift:     Recent Labs  04/22/16 0620  HGB 12.6*    Recent Labs  04/22/16 0620  WBC 5.4  RBC 4.36  HCT 37.2*  PLT 174    Recent Labs  04/22/16 0620  NA 135  K 4.4  CL 96*  CO2 27  BUN 7  CREATININE 1.33*  GLUCOSE 79  CALCIUM 8.9    Recent Labs  04/22/16 1353  INR 1.06    Right forearm bandage intact. No dainage  Assessment/Plan: 3 Days Post-Op Procedure(s) (LRB): IRRIGATION AND DEBRIDEMENT EXTREMITY (Right) REMOVAL FOREIGN BODY EXTREMITY (Right)  Continue hydrotherapy dressing changes with PT. Await final sensitivities to convert to po antibiotics  Latrail Pounders V 04/24/2016, 8:22 AM

## 2016-04-24 NOTE — Progress Notes (Signed)
Pharmacy Antibiotic Note Tony Walsh is a 25 y.o. male admitted on 04/21/2016 with R forearm infection due to IVDA with needle retained in the forearm. S/p OR 5/17 for I&D - cultures obtained. Currently on day 4 of Zosyn and vancomycin.   VT obtained today was 6 drawn correctly on 750 mg Q12H, SCr improved since admission.   Plan: 1. Increase vancomycin to 1000 mg every 8 hours starting now 2. Zosyn 3.375gm IV q8h - doses over 4 hours 3. Will f/u micro data, renal function, and pt's clinical condition 4. Noted plans to change to PO abx once cultures have finalized    Height: 6\' 4"  (193 cm) Weight: 240 lb (108.863 kg) IBW/kg (Calculated) : 86.8  Temp (24hrs), Avg:97.9 F (36.6 C), Min:97.7 F (36.5 C), Max:98 F (36.7 C)   Recent Labs Lab 04/20/16 2145 04/21/16 0628 04/21/16 0812 04/22/16 0620 04/24/16 1633  WBC 8.3 8.6 8.6 5.4  --   CREATININE 1.26* 1.18 1.24 1.33* 1.01  VANCOTROUGH  --   --   --   --  6*    Estimated Creatinine Clearance: 149.9 mL/min (by C-G formula based on Cr of 1.01).    No Known Allergies  Antimicrobials this admission: 5/17 Vanc >>  5/17 Ziosyn >>   Dose adjustments this admission: n/a  Microbiology results: 5/17 BCx x2: ngtd 5/17 abscess cx: 5/17 tissue cx: px  Thank you for allowing pharmacy to be a part of this patient's care.  Pollyann SamplesAndy Nikcole Eischeid, PharmD, BCPS 04/24/2016, 5:29 PM Pager: [phone]

## 2016-04-25 LAB — ANAEROBIC CULTURE

## 2016-04-25 LAB — CULTURE, ROUTINE-ABSCESS

## 2016-04-25 MED ORDER — HYDROMORPHONE HCL 1 MG/ML IJ SOLN
2.0000 mg | Freq: Every day | INTRAMUSCULAR | Status: DC | PRN
  Administered 2016-04-25 – 2016-05-04 (×20): 2 mg via INTRAVENOUS
  Filled 2016-04-25 (×22): qty 2

## 2016-04-25 MED ORDER — SODIUM CHLORIDE 0.9% FLUSH: 9.0000 mL | INTRAVENOUS | Status: DC | PRN

## 2016-04-25 MED ORDER — DIPHENHYDRAMINE HCL 50 MG/ML IJ SOLN: 12.5000 mg | Freq: Four times a day (QID) | INTRAMUSCULAR | Status: DC | PRN

## 2016-04-25 MED ORDER — HYDROMORPHONE 1 MG/ML IV SOLN
INTRAVENOUS | Status: DC
  Administered 2016-04-25: 2.4 mg via INTRAVENOUS
  Administered 2016-04-25: 3.9 mg via INTRAVENOUS
  Administered 2016-04-25: 14:00:00 via INTRAVENOUS
  Administered 2016-04-26: 1.2 mg via INTRAVENOUS
  Administered 2016-04-26: 3.3 mg via INTRAVENOUS
  Administered 2016-04-26: 2.7 mg via INTRAVENOUS
  Administered 2016-04-26: 3.3 mg via INTRAVENOUS
  Administered 2016-04-26: 3.6 mg via INTRAVENOUS
  Administered 2016-04-26: 1.8 mg via INTRAVENOUS
  Administered 2016-04-26: 2.7 mg via INTRAVENOUS
  Administered 2016-04-26: 14:00:00 via INTRAVENOUS
  Administered 2016-04-27: 0.6 mg via INTRAVENOUS
  Administered 2016-04-27: 1.3 mg via INTRAVENOUS
  Administered 2016-04-27: 1.2 mg via INTRAVENOUS
  Administered 2016-04-27: 3.6 mg via INTRAVENOUS
  Administered 2016-04-27: 3.3 mg via INTRAVENOUS
  Administered 2016-04-28: 3 mg via INTRAVENOUS
  Administered 2016-04-28: 0.3 mg via INTRAVENOUS
  Administered 2016-04-28: 4.5 mg via INTRAVENOUS
  Administered 2016-04-28: 0.9 mg via INTRAVENOUS
  Administered 2016-04-28: 3 mg via INTRAVENOUS
  Administered 2016-04-28: 3.6 mg via INTRAVENOUS
  Administered 2016-04-29: 2.1 mg via INTRAVENOUS
  Administered 2016-04-29: 22:00:00 via INTRAVENOUS
  Administered 2016-04-29: 6.9 mg via INTRAVENOUS
  Administered 2016-04-29: 3.6 mg via INTRAVENOUS
  Administered 2016-04-30: 2.4 mg via INTRAVENOUS
  Administered 2016-04-30: 2.1 mg via INTRAVENOUS
  Administered 2016-04-30: 3.6 mg via INTRAVENOUS
  Administered 2016-04-30: 1.5 mg via INTRAVENOUS
  Administered 2016-04-30: 0.3 mg via INTRAVENOUS
  Administered 2016-04-30: 1.8 mg via INTRAVENOUS
  Administered 2016-05-01: 2.4 mg via INTRAVENOUS
  Administered 2016-05-01: 2.7 mg via INTRAVENOUS
  Administered 2016-05-01: 3.3 mg via INTRAVENOUS
  Filled 2016-04-25 (×4): qty 25

## 2016-04-25 MED ORDER — ONDANSETRON HCL 4 MG/2ML IJ SOLN: 4.0000 mg | Freq: Four times a day (QID) | INTRAMUSCULAR | Status: DC | PRN

## 2016-04-25 MED ORDER — DIPHENHYDRAMINE HCL 12.5 MG/5ML PO ELIX: 12.5000 mg | ORAL_SOLUTION | Freq: Four times a day (QID) | ORAL | Status: DC | PRN

## 2016-04-25 MED ORDER — NALOXONE HCL 0.4 MG/ML IJ SOLN: 0.4000 mg | INTRAMUSCULAR | Status: DC | PRN

## 2016-04-25 NOTE — Progress Notes (Signed)
Progress Note    Tony Walsh  UJW:[id number] DOB: Aug 19, 1991  DOA: 04/21/2016 PCP: No PCP Per Patient    Brief Narrative:   Tony Walsh is an 25 y.o. male the PMH of IV drug abuse admitted for treatment of a right forearm/antecubital fossa abscess with a retained foreign body. Hand surgery subsequently consulted.  Assessment/Plan:   Principal Problem:   Abscess of right forearm Status post incision and drainage by Dr. Amanda Pea 04/20/16. Gram stain showing gram-positive cocci in pairs and gram-negative rods. Abscess cultures still pending but tissue cultures were negative. Continue empiric vancomycin and Zosyn for now. Narrow based on culture data when finalized. Continue hydrotherapy. Home when cleared by hand surgeon.  Active Problems:   IV drug abuse/hepatitis C positive Urine drug screen positive for opiates, cocaine and benzodiazepines. HIV nonreactive. Hepatitis C antibody positive. Discussed case with Dr. Ninetta Lights, studies he recommended are as follows: Ferritin 155, PT 14/INR 1.06, ANA negative, viral load 193,000 with a log of 5.286, genotype: 3. Will need outpatient ID follow-up. Patient continues to be IV narcotic seeking and continues to continuously ask for IV dilaudid for pain relief despite oral therapy.  Will change to a PCA pump to avoid large doses of opiates all at once.  Family Communication/Anticipated D/C date and plan/Code Status   DVT prophylaxis: Lovenox ordered. Code Status: Full Code.  Family Communication: Wife at the bedside. Disposition Plan: Home when cleared by hand surgeon.   Medical Consultants:    Dr. Amanda Pea: Hand Surgery  Procedures:   Surgery 04/21/16 PROCEDURE PERFORMED: 1. Irrigation and debridement of skin, subcutaneous tissue, muscle,  and tendon. Excisional debridement with curette, knife, blade, and  scissor. This was an excisional debridement. 2. Removal of foreign body, right forearm, in the form of a needle   broken in nature off the syringe. 3. Fascial release and fasciotomy with fascial excision, right  forearm, dorsal aspect. 4. Radical extensor tenolysis, tenosynovectomy of the EDC and  __________ (APL and EPB) musculotendinous regions about the wrist  and forearm. 5. Evacuation of deep abscess, right forearm.  2-D echo 04/23/16  Impressions:  - Normal LV systolic and diastolic function; mild LAE; trace MR and TR.  Anti-Infectives:   Zosyn 04/21/16---> Vancomycin 04/21/16--->  Subjective:   Wagner D Smisek continues to be very manipulative and opiate seeking. RN has paged me today asking if he can have a "one time" dose of IV push dilaudid.  He continues to report pain despite adequate oral doses of dilaudid.    Objective:    Filed Vitals:   04/24/16 0643 04/24/16 1319 04/24/16 2330 04/25/16 0655  BP: 131/89 138/56 129/66 125/68  Pulse: 81 67 67 65  Temp: 98 F (36.7 C) 98 F (36.7 C) 98.7 F (37.1 C) 98.3 F (36.8 C)  TempSrc: Oral Oral Oral Oral  Resp: Height:      Weight:      SpO2: 100% 100% 99% 99%    Intake/Output Summary (Last 24 hours) at 04/25/16 0833 Last data filed at 04/24/16 1700  Gross per 24 hour  Intake    480 ml  Output      0 ml  Net    480 ml   Filed Weights   04/21/16 1600  Weight: 108.863 kg (240 lb)    Exam: General exam: Calm today. Respiratory system: Clear to auscultation. Respiratory effort normal. Cardiovascular system: S1 & S2 heard, RRR. No JVD,  rubs, gallops or  clicks. No murmurs. Gastrointestinal system: Abdomen is nondistended, soft and nontender. No organomegaly or masses felt. Normal bowel sounds heard. Central nervous system: Alert and oriented. No focal neurological deficits. Extremities: Left forearm in dressings that are clean and intact. Skin: No rashes, lesions or ulcers. +Needle tracks. Psychiatry: Judgement and insight appear diminished.   Data Reviewed:   I have personally reviewed  following labs and imaging studies:  Labs: Basic Metabolic Panel:  Recent Labs Lab 04/20/16 2145 04/21/16 0628 04/21/16 0812 04/22/16 0620 04/24/16 1633  NA 135 137 137 135 138  K 4.1 4.5 4.0 4.4 4.5  CL 103 101 99* 96* 103  CO2 25 25 25 27 26   GLUCOSE 102* 79 111* 79 117*  BUN 10 10 8 7 10   CREATININE 1.26* 1.18 1.24 1.33* 1.01  CALCIUM 9.0 9.0 9.0 8.9 9.2   GFR Estimated Creatinine Clearance: 149.9 mL/min (by C-G formula based on Cr of 1.01). Liver Function Tests:  Recent Labs Lab 04/20/16 2145 04/21/16 0812  AST 15 23  ALT 13* 14*  ALKPHOS 36* 33*  BILITOT 0.5 1.0  PROT 7.3 6.7  ALBUMIN 3.7 3.3*   CBC:  Recent Labs Lab 04/20/16 2145 04/21/16 0628 04/21/16 0812 04/22/16 0620  WBC 8.3 8.6 8.6 5.4  NEUTROABS 5.9  --  6.0  --   HGB 13.2 12.7* 12.6* 12.6*  HCT 39.5 38.7* 39.0 37.2*  MCV 84.0 84.5 84.6 85.3  PLT 177 169 156 174   Urine analysis:    Component Value Date/Time   COLORURINE YELLOW 04/21/2016 0820   APPEARANCEUR CLEAR 04/21/2016 0820   LABSPEC 1.018 04/21/2016 0820   PHURINE 6.5 04/21/2016 0820   GLUCOSEU NEGATIVE 04/21/2016 0820   HGBUR NEGATIVE 04/21/2016 0820   BILIRUBINUR NEGATIVE 04/21/2016 0820   KETONESUR NEGATIVE 04/21/2016 0820   PROTEINUR NEGATIVE 04/21/2016 0820   UROBILINOGEN 0.2 11/10/2010 0913   NITRITE NEGATIVE 04/21/2016 0820   LEUKOCYTESUR NEGATIVE 04/21/2016 0820   Microbiology Recent Results (from the past 240 hour(s))  Culture, blood (Routine X 2) w Reflex to ID Panel     Status: None (Preliminary result)   Collection Time: 04/21/16  2:12 AM  Result Value Ref Range Status   Specimen Description BLOOD RIGHT HAND  Final   Special Requests IN PEDIATRIC BOTTLE 5ML  Final   Culture NO GROWTH 3 DAYS  Final   Report Status PENDING  Incomplete  Culture, blood (Routine X 2) w Reflex to ID Panel     Status: None (Preliminary result)   Collection Time: 04/21/16  2:20 AM  Result Value Ref Range Status   Specimen  Description BLOOD LEFT HAND  Final   Special Requests BOTTLES DRAWN AEROBIC AND ANAEROBIC 5ML  Final   Culture NO GROWTH 3 DAYS  Final   Report Status PENDING  Incomplete  Anaerobic culture     Status: None (Preliminary result)   Collection Time: 04/21/16  4:01 AM  Result Value Ref Range Status   Specimen Description ABSCESS  Final   Special Requests RIGHT FOREARM ABSCESS  Final   Gram Stain   Final    ABUNDANT WBC PRESENT, PREDOMINANTLY PMN NO SQUAMOUS EPITHELIAL CELLS SEEN ABUNDANT GRAM POSITIVE COCCI IN PAIRS ABUNDANT GRAM NEGATIVE RODS Performed at [facility]    Culture   Final    NO ANAEROBES ISOLATED; CULTURE IN PROGRESS FOR 5 DAYS Performed at [facility]    Report Status PENDING  Incomplete  Culture, routine-abscess     Status: None  Collection Time: 04/21/16  4:01 AM  Result Value Ref Range Status   Specimen Description ABSCESS  Final   Special Requests RIGHT FOREARM ABSCESS  Final   Gram Stain   Final    ABUNDANT WBC PRESENT, PREDOMINANTLY PMN NO SQUAMOUS EPITHELIAL CELLS SEEN ABUNDANT GRAM POSITIVE COCCI IN PAIRS ABUNDANT GRAM NEGATIVE RODS Performed at [facility]    Culture   Final    MULTIPLE ORGANISMS PRESENT, NONE PREDOMINANT Note: NO STAPHYLOCOCCUS AUREUS ISOLATED NO GROUP A STREP (S.PYOGENES) ISOLATED Performed at [facility]    Report Status 04/25/2016 FINAL  Final  Tissue culture     Status: None   Collection Time: 04/21/16  4:01 AM  Result Value Ref Range Status   Specimen Description TISSUE  Final   Special Requests RIGHT FOREARM  Final   Gram Stain   Final    RARE WBC PRESENT, PREDOMINANTLY PMN NO ORGANISMS SEEN Performed at [facility]    Culture   Final    NO GROWTH 2 DAYS Performed at [facility]    Report Status 04/24/2016 FINAL  Final    Radiology: No results found.  Medications:   . bisacodyl  5 mg Oral Daily  . docusate sodium  100 mg Oral BID  . enoxaparin (LOVENOX)  injection  40 mg Subcutaneous Daily  .  HYDROmorphone (DILAUDID) injection  2 mg Intravenous QHS  . LORazepam  1 mg Oral QHS  . piperacillin-tazobactam (ZOSYN)  IV  3.375 g Intravenous Q8H  . polyethylene glycol  17 g Oral Daily  . vancomycin  1,000 mg Intravenous Q8H   Continuous Infusions:   Time spent: 25 minutes.   LOS: 4 days   RAMA,CHRISTINA  [facility] Pager [phone]. If unable to reach me by pager, please call my cell phone at [phone].  *Please refer to [email or web address], password [email or web address] to get updated schedule on who will round on this patient, as hospitalists switch teams weekly. If 7PM-7AM, please contact night-coverage at [email or web address], password [email or web address] for any overnight needs.  04/25/2016, 8:33 AM

## 2016-04-25 NOTE — Progress Notes (Signed)
Patient made aware that Dr. Amanda PeaGramig will come to change patient's dressing on today.  Patient verbalized his understanding.

## 2016-04-25 NOTE — Progress Notes (Signed)
Patient is upset because he would like his pain medication in IV form and his hydrotherapy session.  Educated the patient on the routes of administration prescribed for his education.  MD notified that the patient feels his pain is not controlled with current pain medication regimen.  Awaiting new orders.  Patient states he will call his friend to bring him "something" to control his pain.  Educated the patient on the importance of not taking home medications or other substances while under hospital care.  Patient verbalized his understanding of the recent education.  Will continue to monitor patient.

## 2016-04-25 NOTE — Progress Notes (Signed)
Subjective: 4 Days Post-Op Procedure(s) (LRB): IRRIGATION AND DEBRIDEMENT EXTREMITY (Right) REMOVAL FOREIGN BODY EXTREMITY (Right) Patient with visitor in room. Appears very jovial, he currently states he is not hurting but queries if  He can "get an order for the morphine every 2 hours cause it made him feel good!" . I have discussed  With the patient we would not recommend any further IV pain medication as it is obvious by our conversation today he abusing his current situation by drug seeking while inpatient. He has no complaints otherwise.  Objective: Vital signs in last 24 hours: Temp:  [98 F (36.7 C)-98.7 F (37.1 C)] 98.3 F (36.8 C) (05/21 0655) Pulse Rate:  [65-67] 65 (05/21 0655) Resp:  [16] 16 (05/21 0655) BP: (125-138)/(56-68) 125/68 mmHg (05/21 0655) SpO2:  [99 %-100 %] 99 % (05/21 0655)  Intake/Output from previous day: 05/20 0701 - 05/21 0700 In: 720 [P.O.:720] Out: -  Intake/Output this shift:    No results for input(s): HGB in the last 72 hours. No results for input(s): WBC, RBC, HCT, PLT in the last 72 hours.  Recent Labs  04/24/16 1633  NA 138  K 4.5  CL 103  CO2 26  BUN 10  CREATININE 1.01  GLUCOSE 117*  CALCIUM 9.2    Recent Labs  04/22/16 1353  INR 1.06    The patient is alert and oriented in no acute distress. The patient complains of pain in the affected upper extremity.  The patient is noted to have a normal HEENT exam. Lung fields show equal chest expansion and no shortness of breath. Abdomen exam is nontender without distention. Lower extremity examination does not show any fracture dislocation or blood clot symptoms. Pelvis is stable and the neck and back are stable and nontender. Examination of the right upper extremity: dressings are removed wound has been freshly packed no signs of cellulitis or recurrent infection are present, no purulent drainage, healthy granulation tissue, wound edges intact  Assessment/Plan: 4 Days Post-Op  Procedure(s) (LRB): IRRIGATION AND DEBRIDEMENT EXTREMITY (Right) REMOVAL FOREIGN BODY EXTREMITY (Right)  I have discussed with the patient once his final cultures are available we will d/c home on 2 weeks of po antibiotics. The patient has been instructed by therapy on how to perform wet to dry dressing changes daily upon d/c. Patient will need to follow up at our office in 5 to 7 days once discharged. Jonavin Seder L 04/25/2016, 10:15 AM

## 2016-04-25 NOTE — Progress Notes (Signed)
Subjective: 4 Days Post-Op Procedure(s) (LRB): IRRIGATION AND DEBRIDEMENT EXTREMITY (Right) REMOVAL FOREIGN BODY EXTREMITY (Right) Patient reports pain as mild.   Alert and oriented Tolerating his diet Family is in the room and I discussed his care with them  Objective: Vital signs in last 24 hours: Temp:  [98.3 F (36.8 C)-98.7 F (37.1 C)] 98.4 F (36.9 C) (05/21 1500) Pulse Rate:  [65-67] 66 (05/21 1500) Resp:  [16] 16 (05/21 1500) BP: (125-129)/(66-72) 128/72 mmHg (05/21 1500) SpO2:  [98 %-99 %] 99 % (05/21 1500)  Intake/Output from previous day: 05/20 0701 - 05/21 0700 In: 720 [P.O.:720] Out: -  Intake/Output this shift:    No results for input(s): HGB in the last 72 hours. No results for input(s): WBC, RBC, HCT, PLT in the last 72 hours.  Recent Labs  04/24/16 1633  NA 138  K 4.5  CL 103  CO2 26  BUN 10  CREATININE 1.01  GLUCOSE 117*  CALCIUM 9.2   No results for input(s): LABPT, INR in the last 72 hours.   Physical examination:  I was called by nursing staff in regards to his bandage his pain and other issues. It appears that hydrotherapy does not commit on Sundays.  Procedure I performed I and D of his arm skin is obtaining his tissue and muscle this was an excisional debridement less than 10 cm with good looking parameters. I irrigated with a liter of saline and packed the wounds with 4 different open gauze packings. The patient had scissor tip and knife as well as gauze used for a sweeping technique to debride this. Once again this was an excisional debridement. There were no comp caring features.  Physical examination shows normal sensation and motor function to the hand no evidence of cellulitis compartment syndrome dystrophy or neurovascular changes. Overall the exam looks quite improve. I'm pleased this and the findings.   I spent a great deal of time with he and his family today. The patient is alert and oriented in no acute distress. The patient  complains of pain in the affected upper extremity.  The patient is noted to have a normal HEENT exam. Lung fields show equal chest expansion and no shortness of breath. Abdomen exam is nontender without distention. Lower extremity examination does not show any fracture dislocation or blood clot symptoms. Pelvis is stable and the neck and back are stable and nontender.     Assessment/Plan: 4 Days Post-Op Procedure(s) (LRB): IRRIGATION AND DEBRIDEMENT EXTREMITY (Right) REMOVAL FOREIGN BODY EXTREMITY (Right) Patient underwent bedside I and D.  We'll last hydrotherapy to see him tomorrow continue daily hydrotherapy and wound packing. Hopefully by Tuesday Wednesday we can transition to outpatient care.  I discussed with Dr. Darnelle Catalanama  all issues on the findings today as well.  I would consider transition to Augmentin given the culture results. This can be done tomorrow. I recommended Augmentin 875 mg twice a day 3 weeks.  I discussed with patient his pain issues as well. He is been very difficult for the staff to deal with due to his pain management issues. This is well documented in the chart.  It is my interpretation that the patient has adequate pain control. He unfortunately has a history of IV drug abuse and a heightened sense of pain  I discussed with patient that excessive pain medicine is quite dangerous to his system. I also discussed with the patient that the addiction and chasing the "ultimate high"is what led him to this predicament.  It will be the hospitals desire to give him adequate but not excessive pain management and he understands this  All questions have been encouraged and answered day.  Overall he does look improved.  Karen Chafe 04/25/2016, 4:40 PM

## 2016-04-25 NOTE — Progress Notes (Signed)
Patient would like hydrotherapy session, PT has been paged.  Medical MD suggested Ortho MD be paged regarding dressing change.  Patient has received am dose of pain medication.  Will continue to monitor.  Will call security if patient's temperament escalates.

## 2016-04-26 LAB — CULTURE, BLOOD (ROUTINE X 2)
CULTURE: NO GROWTH
CULTURE: NO GROWTH

## 2016-04-26 MED ORDER — TRAZODONE HCL 50 MG PO TABS
50.0000 mg | ORAL_TABLET | Freq: Every evening | ORAL | Status: DC | PRN
  Administered 2016-04-26 – 2016-05-03 (×6): 50 mg via ORAL
  Filled 2016-04-26 (×6): qty 1

## 2016-04-26 MED ORDER — AMOXICILLIN-POT CLAVULANATE 875-125 MG PO TABS
1.0000 | ORAL_TABLET | Freq: Two times a day (BID) | ORAL | Status: DC
  Administered 2016-04-26 – 2016-05-04 (×17): 1 via ORAL
  Filled 2016-04-26 (×18): qty 1

## 2016-04-26 NOTE — Progress Notes (Signed)
Patient ID: Tony Walsh, male   DOB: Nov 28, 1991, 25 y.o.   MRN: [id number] Will dc Lovenox and cont the PT hydro Pt is ambulatory Yuli Lanigan MD

## 2016-04-26 NOTE — Progress Notes (Signed)
Physical Therapy Wound Treatment Patient Details  Name: Tony Walsh MRN: [id number] Date of Birth: [birth year]  Today's Date: 04/26/2016 Time: 4098-1191 Time Calculation (min): 20 min  Subjective  Subjective: Pt agreeable to hydrotherapy after pain medication Patient and Family Stated Goals: heal wounds and save my arm Date of Onset:  (PTA) Prior Treatments: I&D   Pain Score:  Pt was premedicated prior to session.   Wound Assessment  Wound / Incision (Open or Dehisced) 04/22/16 Incision - Open Arm Right Forearm (Active)  Dressing Type Compression wrap;Gauze (Comment);Moist to dry;ABD 04/26/2016  9:26 AM  Dressing Changed Changed 04/26/2016  9:26 AM  Dressing Status Clean;Dry;Intact 04/26/2016  9:26 AM  Dressing Change Frequency Daily 04/26/2016  9:26 AM  Site / Wound Assessment Red;Yellow 04/26/2016  9:26 AM  % Wound base Red or Granulating 95% 04/26/2016  9:26 AM  % Wound base Yellow 5% 04/26/2016  9:26 AM  % Wound base Black 0% 04/26/2016  9:26 AM  % Wound base Other (Comment) 0% 04/26/2016  9:26 AM  Peri-wound Assessment Intact;Induration;Edema 04/26/2016  9:26 AM  Wound Length (cm) 9.5 cm 04/22/2016  2:00 PM  Wound Width (cm) 1.3 cm 04/22/2016  2:00 PM  Wound Depth (cm) 0.6 cm 04/22/2016  2:00 PM  Tunneling (cm) .2 at 7 o'clock 04/22/2016  2:00 PM  Margins Unattached edges (unapproximated) 04/26/2016  9:26 AM  Closure Sutures 04/26/2016  9:26 AM  Drainage Amount Copious 04/26/2016  9:26 AM  Drainage Description Serosanguineous;No odor 04/26/2016  9:26 AM  Non-staged Wound Description Full thickness 04/26/2016  9:26 AM  Treatment Debridement (Selective);Hydrotherapy (Pulse lavage);Packing (Saline gauze) 04/26/2016  9:26 AM   Hydrotherapy Pulsed lavage therapy - wound location: R arm Pulsed Lavage with Suction (psi): 8 psi Pulsed Lavage with Suction - Normal Saline Used: 1000 mL Pulsed Lavage Tip: Tip with splash shield   Wound Assessment and Plan  Wound Therapy -  Assess/Plan/Recommendations Wound Therapy - Clinical Statement: Noted deep pockets in medial areas of wound; difficult to pack due to very narrow. Patient has continued with copious drainage. Continues to benefit from hydrotherapy due to degree of infection Wound Therapy - Functional Problem List: Decreased strength due to pain.  Factors Delaying/Impairing Wound Healing: Substance abuse Hydrotherapy Plan: Dressing change;Debridement;Patient/family education;Pulsatile lavage with suction Wound Therapy - Frequency: 6X / week Wound Therapy - Follow Up Recommendations: Home health RN Wound Plan: See above  Wound Therapy Goals- Improve the function of patient's integumentary system by progressing the wound(s) through the phases of wound healing (inflammation - proliferation - remodeling) by: Patient/Family will be able to : manage self-dressing changes Patient/Family Instruction Goal - Progress: Progressing toward goal Additional Wound Therapy Goal: Demonstrate proper positioning and UE exercises to reduce edema Additional Wound Therapy Goal - Progress: Progressing toward goal Goals/treatment plan/discharge plan were made with and agreed upon by patient/family: Yes Time For Goal Achievement: 7 days Wound Therapy - Potential for Goals: Excellent  Goals will be updated until maximal potential achieved or discharge criteria met.  Discharge criteria: when goals achieved, discharge from hospital, MD decision/surgical intervention, no progress towards goals, refusal/missing three consecutive treatments without notification or medical reason.  GP     Rolinda Roan 04/26/2016, 9:34 AM  Rolinda Roan, PT, DPT Acute Rehabilitation Services Pager: [phone]

## 2016-04-26 NOTE — Progress Notes (Signed)
Progress Note    Tony Walsh  ZOX:[id number] DOB: 1991-03-10  DOA: 04/21/2016 PCP: No PCP Per Patient    Brief Narrative:   Tony Walsh is an 25 y.o. male the PMH of IV drug abuse admitted for treatment of a right forearm/antecubital fossa abscess with a retained foreign body. Hand surgery subsequently consulted.  Assessment/Plan:   Principal Problem:   Abscess of right forearm Status post incision and drainage by Dr. Amanda Pea 04/20/16. Gram stain showing gram-positive cocci in pairs and gram-negative rods. Abscess cultures still pending but tissue cultures were negative. Continue empiric vancomycin and Zosyn for now. Narrow based on culture data when finalized. Continue hydrotherapy. Home when cleared by hand surgeon.  Active Problems:   IV drug abuse/hepatitis C positive Urine drug screen positive for opiates, cocaine and benzodiazepines. HIV nonreactive. Hepatitis C antibody positive. Discussed case with Dr. Ninetta Lights, studies he recommended are as follows: Ferritin 155, PT 14/INR 1.06, ANA negative, viral load 193,000 with a log of 5.286, genotype: 3. Will need outpatient ID follow-up. Patient continues to be IV narcotic seeking and continues to continuously ask for IV dilaudid for pain relief despite oral therapy.  Continue PCA pump to avoid large doses of opiates all at once. Transition to oral therapy soon.  Family Communication/Anticipated D/C date and plan/Code Status   DVT prophylaxis: Lovenox ordered. Code Status: Full Code.  Family Communication: Wife at the bedside. Disposition Plan: Home when cleared by hand surgeon.   Medical Consultants:    Dr. Amanda Pea: Hand Surgery  Procedures:   Surgery 04/21/16 PROCEDURE PERFORMED: 1. Irrigation and debridement of skin, subcutaneous tissue, muscle,  and tendon. Excisional debridement with curette, knife, blade, and  scissor. This was an excisional debridement. 2. Removal of foreign body, right forearm, in  the form of a needle  broken in nature off the syringe. 3. Fascial release and fasciotomy with fascial excision, right  forearm, dorsal aspect. 4. Radical extensor tenolysis, tenosynovectomy of the EDC and  __________ (APL and EPB) musculotendinous regions about the wrist  and forearm. 5. Evacuation of deep abscess, right forearm.  2-D echo 04/23/16  Impressions:  - Normal LV systolic and diastolic function; mild LAE; trace MR and TR.  Anti-Infectives:   Zosyn 04/21/16---> Vancomycin 04/21/16--->  Subjective:   Tony Walsh continues to be very manipulative and opiate seeking. Requesting an IV push of Dilaudid at bedtime. Continues to complain of severe pain with dressing changes but otherwise seems to be comfortable.    Objective:    Filed Vitals:   04/25/16 2101 04/26/16 0017 04/26/16 0443 04/26/16 0705  BP: 133/63   127/74  Pulse: 64   75  Temp: 97.6 F (36.4 C)   98.4 F (36.9 C)  TempSrc: Oral   Oral  Resp: 16 21 15 18   Height:      Weight:      SpO2: 100% 99% 94% 98%   No intake or output data in the 24 hours ending 04/26/16 0806 Filed Weights   04/21/16 1600  Weight: 108.863 kg (240 lb)    Exam: General exam: Calm today. Respiratory system: Clear to auscultation. Respiratory effort normal. Cardiovascular system: S1 & S2 heard, RRR. No JVD,  rubs, gallops or clicks. No murmurs. Gastrointestinal system: Abdomen is nondistended, soft and nontender. No organomegaly or masses felt. Normal bowel sounds heard. Central nervous system: Alert and oriented. No focal neurological deficits. Extremities: Left forearm in dressings that are clean and intact. Skin: No rashes, lesions  or ulcers. +Needle tracks. Psychiatry: Judgement and insight appear diminished.   Data Reviewed:   I have personally reviewed following labs and imaging studies:  Labs: Basic Metabolic Panel:  Recent Labs Lab 04/20/16 2145 04/21/16 0628 04/21/16 0812 04/22/16 0620  04/24/16 1633  NA 135 137 137 135 138  K 4.1 4.5 4.0 4.4 4.5  CL 103 101 99* 96* 103  CO2 GLUCOSE 102* 79 111* 79 117*  BUN CREATININE 1.26* 1.18 1.24 1.33* 1.01  CALCIUM 9.0 9.0 9.0 8.9 9.2   GFR Estimated Creatinine Clearance: 149.9 mL/min (by C-G formula based on Cr of 1.01). Liver Function Tests:  Recent Labs Lab 04/20/16 2145 04/21/16 0812  AST 15 23  ALT 13* 14*  ALKPHOS 36* 33*  BILITOT 0.5 1.0  PROT 7.3 6.7  ALBUMIN 3.7 3.3*   CBC:  Recent Labs Lab 04/20/16 2145 04/21/16 0628 04/21/16 0812 04/22/16 0620  WBC 8.3 8.6 8.6 5.4  NEUTROABS 5.9  --  6.0  --   HGB 13.2 12.7* 12.6* 12.6*  HCT 39.5 38.7* 39.0 37.2*  MCV 84.0 84.5 84.6 85.3  PLT 177 169 156 174   Urine analysis:    Component Value Date/Time   COLORURINE YELLOW 04/21/2016 0820   APPEARANCEUR CLEAR 04/21/2016 0820   LABSPEC 1.018 04/21/2016 0820   PHURINE 6.5 04/21/2016 0820   GLUCOSEU NEGATIVE 04/21/2016 0820   HGBUR NEGATIVE 04/21/2016 0820   BILIRUBINUR NEGATIVE 04/21/2016 0820   KETONESUR NEGATIVE 04/21/2016 0820   PROTEINUR NEGATIVE 04/21/2016 0820   UROBILINOGEN 0.2 11/10/2010 0913   NITRITE NEGATIVE 04/21/2016 0820   LEUKOCYTESUR NEGATIVE 04/21/2016 0820   Microbiology Recent Results (from the past 240 hour(s))  Culture, blood (Routine X 2) w Reflex to ID Panel     Status: None (Preliminary result)   Collection Time: 04/21/16  2:12 AM  Result Value Ref Range Status   Specimen Description BLOOD RIGHT HAND  Final   Special Requests IN PEDIATRIC BOTTLE  Final   Culture NO GROWTH 4 DAYS  Final   Report Status PENDING  Incomplete  Culture, blood (Routine X 2) w Reflex to ID Panel     Status: None (Preliminary result)   Collection Time: 04/21/16  2:20 AM  Result Value Ref Range Status   Specimen Description BLOOD LEFT HAND  Final   Special Requests BOTTLES DRAWN AEROBIC AND ANAEROBIC  Final   Culture NO GROWTH 4 DAYS  Final   Report Status  PENDING  Incomplete  Anaerobic culture     Status: None   Collection Time: 04/21/16  4:01 AM  Result Value Ref Range Status   Specimen Description ABSCESS  Final   Special Requests RIGHT FOREARM ABSCESS  Final   Gram Stain   Final    ABUNDANT WBC PRESENT, PREDOMINANTLY PMN NO SQUAMOUS EPITHELIAL CELLS SEEN ABUNDANT GRAM POSITIVE COCCI IN PAIRS ABUNDANT GRAM NEGATIVE RODS Performed at [facility]    Culture   Final    NO ANAEROBES ISOLATED Performed at [facility]    Report Status 04/25/2016 FINAL  Final  Culture, routine-abscess     Status: None   Collection Time: 04/21/16  4:01 AM  Result Value Ref Range Status   Specimen Description ABSCESS  Final   Special Requests RIGHT FOREARM ABSCESS  Final   Gram Stain   Final    ABUNDANT WBC PRESENT, PREDOMINANTLY PMN NO SQUAMOUS EPITHELIAL CELLS SEEN ABUNDANT GRAM  POSITIVE COCCI IN PAIRS ABUNDANT GRAM NEGATIVE RODS [facility]    Culture   Final    MULTIPLE ORGANISMS PRESENT, NONE PREDOMINANT Note: NO STAPHYLOCOCCUS AUREUS ISOLATED NO GROUP A STREP (S.PYOGENES) ISOLATED Performed at [facility]    Report Status 04/25/2016 FINAL  Final  Tissue culture     Status: None   Collection Time: 04/21/16  4:01 AM  Result Value Ref Range Status   Specimen Description TISSUE  Final   Special Requests RIGHT FOREARM  Final   Gram Stain   Final    RARE WBC PRESENT, PREDOMINANTLY PMN NO ORGANISMS SEEN Performed at [facility]    Culture   Final    NO GROWTH 2 DAYS [facility]formed at [facility]    Report Status 04/24/2016 FINAL  Final    Radiology: No results found.  Medications:   . bisacodyl  5 mg Oral Daily  . docusate sodium  100 mg Oral BID  . enoxaparin (LOVENOX) injection  40 mg Subcutaneous Daily  . HYDROmorphone   Intravenous Q4H  . piperacillin-tazobactam (ZOSYN)  IV  3.375 g Intravenous Q8H  . polyethylene glycol  17 g Oral Daily  . vancomycin  1,000 mg  Intravenous Q8H   Continuous Infusions:   Time spent: 25 minutes.   LOS: 5 days   RAMA,CHRISTINA  [facility] Pager [phone]. If unable to reach me by pager, please call my cell phone at [phone].  *Please refer to [email or web address], password [email or web address] to get updated schedule on who will round on this patient, as hospitalists switch teams weekly. If 7PM-7AM, please contact night-coverage at [email or web address], password [email or web address] for any overnight needs.  04/26/2016, 8:06 AM

## 2016-04-26 NOTE — Progress Notes (Signed)
Patient ID: Tony Walsh, male   DOB: 1991/09/07, 25 y.o.   MRN: [id number] Patient is in good spirits.  Patient's arm is stable.  He remains neurovascularly intact.  Hydrotherapy notes are reviewed and certainly we need to continue a hydrotherapy Route with wound packing until the drainage slows down. I feel that it may be mid or and week until this occurs. Patient is aware of this.  We'll continue the wound care plan this outlined.  Unfortunately there is not much opportunity for a outpatient program to the wound care center at this time  Vladimir CroftsGramig M.D.

## 2016-04-27 LAB — MISC LABCORP TEST (SEND OUT): Labcorp test code: 550123

## 2016-04-27 MED ORDER — KETOROLAC TROMETHAMINE 30 MG/ML IJ SOLN
30.0000 mg | Freq: Four times a day (QID) | INTRAMUSCULAR | Status: AC | PRN
  Administered 2016-04-29 (×2): 30 mg via INTRAVENOUS
  Filled 2016-04-27 (×2): qty 1

## 2016-04-27 NOTE — Progress Notes (Signed)
Physical Therapy Wound Treatment Patient Details  Name: Tony Walsh MRN: [id number] Date of Birth: 1991-06-03  Today's Date: 04/27/2016 Time: 2440-1027 Time Calculation (min): 23 min  Subjective  Subjective: Pt agreeable to hydrotherapy after pain medication Patient and Family Stated Goals: heal wounds and save my arm Date of Onset:  (PTA) Prior Treatments: I&D   Pain Score:  Pt was premedicated prior to session and reports increased pain during treatment on proximal wound area  Wound Assessment  Wound / Incision (Open or Dehisced) 04/22/16 Incision - Open Arm Right Forearm (Active)  Dressing Type Compression wrap;Gauze (Comment);Moist to dry;ABD 04/27/2016  1:55 PM  Dressing Changed Changed 04/27/2016  1:55 PM  Dressing Status Clean;Dry;Intact 04/27/2016  1:55 PM  Dressing Change Frequency Daily 04/27/2016  1:55 PM  Site / Wound Assessment Red;Yellow 04/27/2016  1:55 PM  % Wound base Red or Granulating 95% 04/27/2016  1:55 PM  % Wound base Yellow 5% 04/27/2016  1:55 PM  % Wound base Black 0% 04/27/2016  1:55 PM  % Wound base Other (Comment) 0% 04/27/2016  1:55 PM  Peri-wound Assessment Intact;Induration;Edema 04/27/2016  1:55 PM  Wound Length (cm) 9.5 cm 04/22/2016  2:00 PM  Wound Width (cm) 1.3 cm 04/22/2016  2:00 PM  Wound Depth (cm) 0.6 cm 04/22/2016  2:00 PM  Tunneling (cm) .2 at 7 o'clock 04/22/2016  2:00 PM  Margins Unattached edges (unapproximated) 04/27/2016  1:55 PM  Closure Sutures 04/27/2016  1:55 PM  Drainage Amount Copious 04/27/2016  1:55 PM  Drainage Description Serosanguineous;No odor 04/27/2016  1:55 PM  Non-staged Wound Description Full thickness 04/27/2016  1:55 PM  Treatment Hydrotherapy (Pulse lavage);Packing (Saline gauze) 04/27/2016  1:55 PM  Hydrotherapy Pulsed lavage therapy - wound location: R arm Pulsed Lavage with Suction (psi): 8 psi (4 at times due to pain) Pulsed Lavage with Suction - Normal Saline Used: 1000 mL Pulsed Lavage Tip: Tip with splash shield    Wound Assessment and Plan  Wound Therapy - Assess/Plan/Recommendations Wound Therapy - Clinical Statement: Noted deep pockets in medial areas of wound; difficult to pack due to very narrow. Patient has continued with copious drainage. Continues to benefit from hydrotherapy due to degree of infection Wound Therapy - Functional Problem List: Decreased strength due to pain.  Factors Delaying/Impairing Wound Healing: Substance abuse Hydrotherapy Plan: Dressing change;Debridement;Patient/family education;Pulsatile lavage with suction Wound Therapy - Frequency: 6X / week Wound Therapy - Follow Up Recommendations: Home health RN Wound Plan: See above  Wound Therapy Goals- Improve the function of patient's integumentary system by progressing the wound(s) through the phases of wound healing (inflammation - proliferation - remodeling) by: Patient/Family will be able to : manage self-dressing changes Patient/Family Instruction Goal - Progress: Progressing toward goal Additional Wound Therapy Goal: Demonstrate proper positioning and UE exercises to reduce edema Additional Wound Therapy Goal - Progress: Progressing toward goal Goals/treatment plan/discharge plan were made with and agreed upon by patient/family: Yes Time For Goal Achievement: 7 days Wound Therapy - Potential for Goals: Excellent  Goals will be updated until maximal potential achieved or discharge criteria met.  Discharge criteria: when goals achieved, discharge from hospital, MD decision/surgical intervention, no progress towards goals, refusal/missing three consecutive treatments without notification or medical reason.  GP     Rolinda Roan 04/27/2016, 1:58 PM   Rolinda Roan, PT, DPT Acute Rehabilitation Services Pager: [phone]

## 2016-04-27 NOTE — Progress Notes (Signed)
Progress Note    Tony NearingShymell D Depass  EXB:[id number] DOB: November 16, 1991  DOA: 04/21/2016 PCP: No PCP Per Patient    Brief Narrative:   Tony Walsh is an 25 y.o. male the PMH of IV drug abuse admitted for treatment of a right forearm/antecubital fossa abscess with a retained foreign body. Hand surgery subsequently consulted.  Assessment/Plan:   Principal Problem:   Abscess of right forearm Status post incision and drainage by Dr. Amanda PeaGramig 04/20/16. Gram stain showing gram-positive cocci in pairs and gram-negative rods. Abscess cultures still pending but tissue cultures were negative. Antibiotics narrowed to oral Augmentin after being treated with 6 days of Zosyn/vancomycin. Wound cultures were polymicrobial. Continue hydrotherapy. Home when cleared by hand surgeon.  Active Problems:   IV drug abuse/hepatitis C positive Urine drug screen positive for opiates, cocaine and benzodiazepines. HIV nonreactive. Hepatitis C antibody positive. Discussed case with Dr. Ninetta LightsHatcher, studies he recommended are as follows: Ferritin 155, PT 14/INR 1.06, ANA negative, viral load 193,000 with a log of 5.286, genotype: 3. Will need outpatient ID follow-up. Patient continues to be IV narcotic seeking and continues to continuously ask for IV dilaudid at higher doses for pain relief despite PCA pump to avoid large doses of opiates all at once. Transition to oral therapy soon.  Family Communication/Anticipated D/C date and plan/Code Status   DVT prophylaxis: Lovenox ordered. Code Status: Full Code.  Family Communication: Wife at the bedside. Disposition Plan: Home when cleared by hand surgeon.  Medical Consultants:    Dr. Amanda PeaGramig: Hand Surgery  Procedures:   Surgery 04/21/16 PROCEDURE PERFORMED: 1. Irrigation and debridement of skin, subcutaneous tissue, muscle,  and tendon. Excisional debridement with curette, knife, blade, and  scissor. This was an excisional debridement. 2. Removal of foreign  body, right forearm, in the form of a needle  broken in nature off the syringe. 3. Fascial release and fasciotomy with fascial excision, right  forearm, dorsal aspect. 4. Radical extensor tenolysis, tenosynovectomy of the EDC and  __________ (APL and EPB) musculotendinous regions about the wrist  and forearm. 5. Evacuation of deep abscess, right forearm.  2-D echo 04/23/16  Impressions:  - Normal LV systolic and diastolic function; mild LAE; trace MR and TR.  Anti-Infectives:   Zosyn 04/21/16---> 04/26/16 Vancomycin 04/21/16---> 04/26/16 Augmentin 04/26/16--->  Subjective:   Tony Walsh continues to be very manipulative and opiate seeking. Continues to request an IV push of Dilaudid at bedtime. Continues to assert that his pain relief is not fully adequate despite acknowledging that most of the time he is relatively comfortable and that his pain is mainly associated with dressing changes.  Objective:    Filed Vitals:   04/26/16 2346 04/26/16 2353 04/27/16 0624 04/27/16 0633  BP: 108/44  144/57   Pulse: 61  76   Temp: 97.8 F (36.6 C)  97.5 F (36.4 C)   TempSrc: Oral  Oral   Resp: 17 15 17 14   Height:      Weight:      SpO2: 98% 97% 100% 95%    Intake/Output Summary (Last 24 hours) at 04/27/16 0840 Last data filed at 04/27/16 0600  Gross per 24 hour  Intake    650 ml  Output      0 ml  Net    650 ml   Filed Weights   04/21/16 1600  Weight: 108.863 kg (240 lb)    Exam: General exam: Calm today. Respiratory system: Clear to auscultation. Respiratory effort normal. Cardiovascular system:  S1 & S2 heard, RRR. No JVD,  rubs, gallops or clicks. No murmurs. Gastrointestinal system: Abdomen is nondistended, soft and nontender. No organomegaly or masses felt. Normal bowel sounds heard. Central nervous system: Alert and oriented. No focal neurological deficits. Extremities: Left forearm in dressings that are clean and intact. Skin: No rashes, lesions or  ulcers. +Needle tracks. Psychiatry: Judgement and insight appear diminished.   Data Reviewed:   I have personally reviewed following labs and imaging studies:  Labs: Basic Metabolic Panel:  Recent Labs Lab 04/20/16 2145 04/21/16 0628 04/21/16 0812 04/22/16 0620 04/24/16 1633  NA 135 137 137 135 138  K 4.1 4.5 4.0 4.4 4.5  CL 103 101 99* 96* 103  CO2 GLUCOSE 102* 79 111* 79 117*  BUN CREATININE 1.26* 1.18 1.24 1.33* 1.01  CALCIUM 9.0 9.0 9.0 8.9 9.2   GFR Estimated Creatinine Clearance: 149.9 mL/min (by C-G formula based on Cr of 1.01). Liver Function Tests:  Recent Labs Lab 04/20/16 2145 04/21/16 0812  AST 15 23  ALT 13* 14*  ALKPHOS 36* 33*  BILITOT 0.5 1.0  PROT 7.3 6.7  ALBUMIN 3.7 3.3*   CBC:  Recent Labs Lab 04/20/16 2145 04/21/16 0628 04/21/16 0812 04/22/16 0620  WBC 8.3 8.6 8.6 5.4  NEUTROABS 5.9  --  6.0  --   HGB 13.2 12.7* 12.6* 12.6*  HCT 39.5 38.7* 39.0 37.2*  MCV 84.0 84.5 84.6 85.3  PLT 177 169 156 174   Urine analysis:    Component Value Date/Time   COLORURINE YELLOW 04/21/2016 0820   APPEARANCEUR CLEAR 04/21/2016 0820   LABSPEC 1.018 04/21/2016 0820   PHURINE 6.5 04/21/2016 0820   GLUCOSEU NEGATIVE 04/21/2016 0820   HGBUR NEGATIVE 04/21/2016 0820   BILIRUBINUR NEGATIVE 04/21/2016 0820   KETONESUR NEGATIVE 04/21/2016 0820   PROTEINUR NEGATIVE 04/21/2016 0820   UROBILINOGEN 0.2 11/10/2010 0913   NITRITE NEGATIVE 04/21/2016 0820   LEUKOCYTESUR NEGATIVE 04/21/2016 0820   Microbiology Recent Results (from the past 240 hour(s))  Culture, blood (Routine X 2) w Reflex to ID Panel     Status: None   Collection Time: 04/21/16  2:12 AM  Result Value Ref Range Status   Specimen Description BLOOD RIGHT HAND  Final   Special Requests IN PEDIATRIC BOTTLE  Final   Culture NO GROWTH 5 DAYS  Final   Report Status 04/26/2016 FINAL  Final  Culture, blood (Routine X 2) w Reflex to ID Panel     Status: None    Collection Time: 04/21/16  2:20 AM  Result Value Ref Range Status   Specimen Description BLOOD LEFT HAND  Final   Special Requests BOTTLES DRAWN AEROBIC AND ANAEROBIC  Final   Culture NO GROWTH 5 DAYS  Final   Report Status 04/26/2016 FINAL  Final  Anaerobic culture     Status: None   Collection Time: 04/21/16  4:01 AM  Result Value Ref Range Status   Specimen Description ABSCESS  Final   Special Requests RIGHT FOREARM ABSCESS  Final   Gram Stain   Final    ABUNDANT WBC PRESENT, PREDOMINANTLY PMN NO SQUAMOUS EPITHELIAL CELLS SEEN ABUNDANT GRAM POSITIVE COCCI IN PAIRS ABUNDANT GRAM NEGATIVE RODS Performed at [facility]    Culture   Final    NO ANAEROBES ISOLATED Performed at [facility]    Report Status 04/25/2016 FINAL  Final  Culture, routine-abscess     Status:  None   Collection Time: 04/21/16  4:01 AM  Result Value Ref Range Status   Specimen Description ABSCESS  Final   Special Requests RIGHT FOREARM ABSCESS  Final   Gram Stain   Final    ABUNDANT WBC PRESENT, PREDOMINANTLY PMN NO SQUAMOUS EPITHELIAL CELLS SEEN ABUNDANT GRAM POSITIVE COCCI IN PAIRS ABUNDANT GRAM NEGATIVE RODS Performed at [facility]    Culture   Final    MULTIPLE ORGANISMS PRESENT, NONE PREDOMINANT Note: NO STAPHYLOCOCCUS AUREUS ISOLATED NO GROUP A STREP (S.PYOGENES) ISOLATED Performed at [facility]    Report Status 04/25/2016 FINAL  Final  Tissue culture     Status: None   Collection Time: 04/21/16  4:01 AM  Result Value Ref Range Status   Specimen Description TISSUE  Final   Special Requests RIGHT FOREARM  Final   Gram Stain   Final    RARE WBC PRESENT, PREDOMINANTLY PMN NO ORGANISMS SEEN Performed at [facility]    Culture   Final    NO GROWTH 2 DAYS Performed at [facility]    Report Status 04/24/2016 FINAL  Final    Radiology: No results found.  Medications:   . amoxicillin-clavulanate  1 tablet Oral Q12H  .  bisacodyl  5 mg Oral Daily  . docusate sodium  100 mg Oral BID  . HYDROmorphone   Intravenous Q4H  . polyethylene glycol  17 g Oral Daily   Continuous Infusions:   Time spent: 25 minutes.   LOS: 6 days   RAMA,CHRISTINA  [facility] Pager [phone]. If unable to reach me by pager, please call my cell phone at [phone].  *Please refer to [email or web address], password [email or web address] to get updated schedule on who will round on this patient, as hospitalists switch teams weekly. If 7PM-7AM, please contact night-coverage at [email or web address], password [email or web address] for any overnight needs.  04/27/2016, 8:40 AM

## 2016-04-27 NOTE — Progress Notes (Signed)
Patient ID: Tony Walsh, male   DOB: Mar 15, 1991, 25 y.o.   MRN: [id number] Patient is seen at bedside. Patient has no signs of  Worsening. His wounds are quite stable at present time.  I would recommend continue whirlpool and wicking.  The patient certainly fluid  Move forward with outpatient nursing carehowever this is not available.  As seen as we can engage him in his dressing changes and he does not have to wicked the wounds aggressively I feel he can move towards wet-to-dry dressing changes at home. Perhaps at the end of the week we will realize this.  At present time the wound looks excellent he has no infectious issues that are worsening and I feel we are on a good course.  Jazzalyn Loewenstein M.D.

## 2016-04-28 ENCOUNTER — Encounter (HOSPITAL_COMMUNITY): Payer: Self-pay | Admitting: Internal Medicine

## 2016-04-28 DIAGNOSIS — B182 Chronic viral hepatitis C: Secondary | ICD-10-CM

## 2016-04-28 NOTE — Progress Notes (Signed)
Physical Therapy Wound Treatment Patient Details  Name: Tony Walsh MRN: [id number] Date of Birth: [birth year]  Today's Date: 04/28/2016 Time: 8127-5170 Time Calculation (min): 20 min  Subjective  Subjective: Pt agreeable to hydrotherapy after pain medication Patient and Family Stated Goals: heal wounds and save my arm Date of Onset:  (PTA) Prior Treatments: I&D   Pain Score: Pt was premedicated prior to treatment.  Wound Assessment  Wound / Incision (Open or Dehisced) 04/22/16 Incision - Open Arm Right Forearm (Active)  Dressing Type Compression wrap;Gauze (Comment);Moist to dry;ABD 04/28/2016 12:00 PM  Dressing Changed Changed 04/28/2016 12:00 PM  Dressing Status Clean;Dry;Intact 04/28/2016 12:00 PM  Dressing Change Frequency Daily 04/28/2016 12:00 PM  Site / Wound Assessment Red;Yellow 04/28/2016 12:00 PM  % Wound base Red or Granulating 95% 04/28/2016 12:00 PM  % Wound base Yellow 5% 04/28/2016 12:00 PM  % Wound base Black 0% 04/28/2016 12:00 PM  % Wound base Other (Comment) 0% 04/28/2016 12:00 PM  Peri-wound Assessment Intact;Induration;Edema 04/28/2016 12:00 PM  Wound Length (cm) 9.5 cm 04/22/2016  2:00 PM  Wound Width (cm) 1.3 cm 04/22/2016  2:00 PM  Wound Depth (cm) 0.6 cm 04/22/2016  2:00 PM  Tunneling (cm) .2 at 7 o'clock 04/22/2016  2:00 PM  Margins Unattached edges (unapproximated) 04/28/2016 12:00 PM  Closure Sutures 04/28/2016 12:00 PM  Drainage Amount Copious 04/28/2016 12:00 PM  Drainage Description Serosanguineous;No odor 04/28/2016 12:00 PM  Non-staged Wound Description Full thickness 04/28/2016 12:00 PM  Treatment Hydrotherapy (Pulse lavage);Packing (Saline gauze) 04/28/2016 12:00 PM  Hydrotherapy Pulsed lavage therapy - wound location: R arm Pulsed Lavage with Suction (psi): 8 psi (4 at times due to pain) Pulsed Lavage with Suction - Normal Saline Used: 1000 mL Pulsed Lavage Tip: Tip with splash shield   Wound Assessment and Plan  Wound Therapy -  Assess/Plan/Recommendations Wound Therapy - Clinical Statement: Drainage appears to be improving. Continues to benefit from hydrotherapy due to degree of infection Wound Therapy - Functional Problem List: Decreased strength due to pain.  Factors Delaying/Impairing Wound Healing: Substance abuse Hydrotherapy Plan: Dressing change;Debridement;Patient/family education;Pulsatile lavage with suction Wound Therapy - Frequency: 6X / week Wound Therapy - Follow Up Recommendations: Home health RN Wound Plan: See above  Wound Therapy Goals- Improve the function of patient's integumentary system by progressing the wound(s) through the phases of wound healing (inflammation - proliferation - remodeling) by: Patient/Family will be able to : manage self-dressing changes Patient/Family Instruction Goal - Progress: Progressing toward goal Additional Wound Therapy Goal: Demonstrate proper positioning and UE exercises to reduce edema Additional Wound Therapy Goal - Progress: Progressing toward goal Goals/treatment plan/discharge plan were made with and agreed upon by patient/family: Yes Time For Goal Achievement: 7 days Wound Therapy - Potential for Goals: Excellent  Goals will be updated until maximal potential achieved or discharge criteria met.  Discharge criteria: when goals achieved, discharge from hospital, MD decision/surgical intervention, no progress towards goals, refusal/missing three consecutive treatments without notification or medical reason.  GP     Rolinda Roan 04/28/2016, 12:18 PM  Rolinda Roan, PT, DPT Acute Rehabilitation Services Pager: [phone]

## 2016-04-28 NOTE — Progress Notes (Signed)
HOSPITALIST DAILY PROGRESS NOTE Hospital Day: 7  Patient Name: Tony Walsh Admission Date/Time: 04/21/2016  3:49 AM  Age/Sex:25 y.o.male MRN#: [id number]  DOB: Jan 03, 1991 Attending Provider: Janann August, MD   LOS: 7 days   PCP/Outpatient Specialists: Patient Care Team: No Pcp Per Patient as PCP - General (General Practice)     Brief Narrative:  Tony Walsh is an 25 y.o. male the PMH of IV drug abuse admitted for treatment of a right forearm/antecubital fossa abscess with a retained foreign body. Hand surgery subsequently consulted. Staying til weekend for appropriate wound care.    Assessment:   Active Hospital Problems   Diagnosis Date Noted  . Abscess of right forearm 04/21/2016  . Chronic hepatitis C (HCC) 04/22/2016  . Cocaine abuse with cocaine-induced mood disorder (HCC) 04/22/2016  . IV drug abuse 04/21/2016    Resolved Hospital Problems   Diagnosis Date Noted Date Resolved  No resolved problems to display.     Plan:  - appreciate Dr. Amanda Pea, patient to stay for ongoing wound care, possible dc on Saturday.  - antibiotics de-escalated from vanco/zosyn to augmentin. Cultures without growth. - Urine drug screen positive for opiates, cocaine and benzodiazepines. HIV nonreactive. Hepatitis C antibody positive. Discussed case with Dr. Ninetta Lights, studies he recommended are as follows: Ferritin 155, PT 14/INR 1.06, ANA negative, viral load 193,000 with a log of 5.286, genotype: 3. Will need outpatient ID follow-up. Patient continues to be IV narcotic seeking and continues to continuously ask for IV dilaudid at higher doses for pain relief despite PCA pump to avoid large doses of opiates all at once. Transition to oral therapy soon.   I examined the patient and reviewed the chart, labs and data. I discussed the patient's status and plan of care with Patient  DVT prophylaxis:  None due to patient being up and ambulatory  Code Status: Full Code Family Communication:    Girlfriend? Disposition Plan:  Plan to DC over weekend.    Consultants:   Dr. Amanda Pea - ortho/hand surgery  Procedures:  Surgery 04/21/16 PROCEDURE PERFORMED: 1. Irrigation and debridement of skin, subcutaneous tissue, muscle,  and tendon. Excisional debridement with curette, knife, blade, and  scissor. This was an excisional debridement. 2. Removal of foreign body, right forearm, in the form of a needle  broken in nature off the syringe. 3. Fascial release and fasciotomy with fascial excision, right  forearm, dorsal aspect. 4. Radical extensor tenolysis, tenosynovectomy of the EDC and  __________ (APL and EPB) musculotendinous regions about the wrist  and forearm. 5. Evacuation of deep abscess, right forearm.  2-D echo 04/23/16 Impressions: - Normal LV systolic and diastolic function; mild LAE; trace MR and TR.  Antimicrobials: Zosyn 04/21/16---> 04/26/16 Vancomycin 04/21/16---> 04/26/16 Augmentin 04/26/16--->  Subjective:  Patient's pain stable on PCA pump. No CP or SOB. No issues with BMs.    Objective:  Temp:  [98.1 F (36.7 C)-98.6 F (37 C)] 98.1 F (36.7 C) (05/24 1638) Pulse Rate:  [84-87] 87 (05/24 1638) Resp:  [14-22] 18 (05/24 1638) BP: (129-138)/(42-59) 129/54 mmHg (05/24 1638) SpO2:  [94 %-100 %] 97 % (05/24 1638) SpO2 Readings from Last 3 Encounters:  04/28/16 97%  04/20/16 97%  04/20/16 98%    Intake/Output Summary (Last 24 hours) at 04/28/16 1802 Last data filed at 04/28/16 0803  Gross per 24 hour  Intake    240 ml  Output      0 ml  Net    240 ml   Filed  Weights   04/21/16 1600  Weight: 108.863 kg (240 lb)   Body mass index is 29.23 kg/(m^2).   Physical Exam  Constitutional: He is oriented to person, place, and time. No distress.  Cardiovascular: Normal rate, regular rhythm and normal heart sounds.   Pulmonary/Chest: Effort normal and breath sounds normal. No respiratory distress. He has no wheezes.  Abdominal: Soft.  He exhibits no distension. There is no tenderness.  Musculoskeletal: He exhibits no edema or tenderness.  Neurological: He is alert and oriented to person, place, and time. GCS score is 15.  Skin: Skin is warm and dry. He is not diaphoretic.  Right arm dressed      Medications:  Scheduled Meds: . amoxicillin-clavulanate  1 tablet Oral Q12H  . bisacodyl  5 mg Oral Daily  . docusate sodium  100 mg Oral BID  . HYDROmorphone   Intravenous Q4H  . polyethylene glycol  17 g Oral Daily   Continuous Infusions:  PRN Meds:  acetaminophen 650 mg Q6H PRN  diphenhydrAMINE 12.5 mg Q6H PRN  Or    diphenhydrAMINE 12.5 mg Q6H PRN  diphenhydrAMINE 25-50 mg Q6H PRN  famotidine 20 mg BID PRN  hydrALAZINE 10 mg Q4H PRN  HYDROmorphone (DILAUDID) injection 2 mg Daily PRN  ketorolac 30 mg Q6H PRN  methocarbamol 500 mg Q6H PRN  Or    methocarbamol (ROBAXIN)  IV 500 mg Q6H PRN  naloxone 0.4 mg PRN  And    sodium chloride flush 9 mL PRN  ondansetron (ZOFRAN) IV 4 mg Q6H PRN  traZODone 50 mg QHS PRN    Labs:  CBC:  Recent Labs Lab 04/22/16 0620  WBC 5.4  HGB 12.6*  HCT 37.2*  MCV 85.3  PLT 174    Basic Metabolic Panel:  Recent Labs Lab 04/22/16 0620 04/24/16 1633  GLUCOSE 79 117*  NA 135 138  K 4.4 4.5  CL 96* 103  CO2 27 26  BUN 7 10  CREATININE 1.33* 1.01  CALCIUM 8.9 9.2    GFR: Estimated Creatinine Clearance: 149.9 mL/min (by C-G formula based on Cr of 1.01).   Coagulation Profile:  Recent Labs Lab 04/22/16 1353  INR 1.06    Latest HbA1C/Lipids/Thyroid/Anemia: Lab Results  Component Value Date   FERRITIN 155 04/22/2016     Recent Results (from the past 240 hour(s))  Culture, blood (Routine X 2) w Reflex to ID Panel     Status: None   Collection Time: 04/21/16  2:12 AM  Result Value Ref Range Status   Specimen Description BLOOD RIGHT HAND  Final   Special Requests IN PEDIATRIC BOTTLE  Final   Culture NO GROWTH 5 DAYS  Final   Report Status  04/26/2016 FINAL  Final  Culture, blood (Routine X 2) w Reflex to ID Panel     Status: None   Collection Time: 04/21/16  2:20 AM  Result Value Ref Range Status   Specimen Description BLOOD LEFT HAND  Final   Special Requests BOTTLES DRAWN AEROBIC AND ANAEROBIC  Final   Culture NO GROWTH 5 DAYS  Final   Report Status 04/26/2016 FINAL  Final  Anaerobic culture     Status: None   Collection Time: 04/21/16  4:01 AM  Result Value Ref Range Status   Specimen Description ABSCESS  Final   Special Requests RIGHT FOREARM ABSCESS  Final   Gram Stain   Final    ABUNDANT WBC PRESENT, PREDOMINANTLY PMN NO SQUAMOUS EPITHELIAL CELLS SEEN ABUNDANT GRAM POSITIVE  COCCI IN PAIRS ABUNDANT GRAM NEGATIVE RODS [facility]    Culture   Final    NO ANAEROBES ISOLATED [facility]formed at [facility]    Report Status 04/25/2016 FINAL  Final  Culture, routine-abscess     Status: None   Collection Time: 04/21/16  4:01 AM  Result Value Ref Range Status   Specimen Description ABSCESS  Final   Special Requests RIGHT FOREARM ABSCESS  Final   Gram Stain   Final    ABUNDANT WBC PRESENT, PREDOMINANTLY PMN NO SQUAMOUS EPITHELIAL CELLS SEEN ABUNDANT GRAM POSITIVE COCCI IN PAIRS ABUNDANT GRAM NEGATIVE RODS [facility]formed [facility]    Culture   Final    MULTIPLE ORGANISMS PRESENT, NONE PREDOMINANT Note: NO STAPHYLOCOCCUS AUREUS ISOLATED NO GROUP A STREP (S.PYOGENES) ISOLATED [facility]    Report Status 04/25/2016 FINAL  Final  Tissue culture     Status: None   Collection Time: 04/21/16  4:01 AM  Result Value Ref Range Status   Specimen Description TISSUE  Final   Special Requests RIGHT FOREARM  Final   Gram Stain   Final    RARE WBC PRESENT, PREDOMINANTLY PMN NO ORGANISMS SEEN [facility]    Culture   Final    NO GROWTH 2 DAYS [facility]formed at [facility]    Report Status 04/24/2016 FINAL  Final     Radiology  Studies: No results found.  Time spent: 25 minutes  Janann AugustLerissa A Deziray Nabi, MD [facility]  If 7PM-7AM, please contact night-coverage [email or web address] Password [email or web address] 04/28/2016, 6:02 PM

## 2016-04-28 NOTE — Progress Notes (Signed)
Patient ID: Tony Walsh, male   DOB: 14-Mar-1991, 25 y.o.   MRN: [id number] Patient is sleeping. I've discussed his care with his significant other at the time in bed with him  Vital signs are stable. He is afebrile.  He is on Augmentin and tolerating this well. At present time he is doing quite well in terms of his wound without active infection.  Once again I feel he is stable from my standpoint. He simply needs good wound care. Unfortunately he and his surrounding family are not going to be capable of performing this. We do not have a source for outpatient wound care through a hospital to serve him at this time. I feel that the best course would be to keep him in the hospital and slowly decrease the wicking in his wound with hydrotherapy. Hopefully he can transition to home Saturday after hydrotherapy.  Patient understands this plan and concurs.  I've had multiple discussions and long conversations regarding substance abuse cessation and addictive  issues with this patient.  All questions have been encouraged and answered.  Lalisa Kiehn M.D.

## 2016-04-29 LAB — COMPREHENSIVE METABOLIC PANEL
ALT: 24 U/L (ref 17–63)
ANION GAP: 6 (ref 5–15)
AST: 19 U/L (ref 15–41)
Albumin: 3.5 g/dL (ref 3.5–5.0)
Alkaline Phosphatase: 36 U/L — ABNORMAL LOW (ref 38–126)
BUN: 13 mg/dL (ref 6–20)
CHLORIDE: 106 mmol/L (ref 101–111)
CO2: 25 mmol/L (ref 22–32)
CREATININE: 1.01 mg/dL (ref 0.61–1.24)
Calcium: 9.2 mg/dL (ref 8.9–10.3)
GFR calc non Af Amer: >60 mL/min (ref >60)
Glucose, Bld: 103 mg/dL — ABNORMAL HIGH (ref 65–99)
Potassium: 4.3 mmol/L (ref 3.5–5.1)
Sodium: 137 mmol/L (ref 135–145)
Total Bilirubin: 0.4 mg/dL (ref 0.3–1.2)
Total Protein: 7 g/dL (ref 6.5–8.1)

## 2016-04-29 LAB — CBC WITH DIFFERENTIAL/PLATELET
Basophils Absolute: 0 10*3/uL (ref 0.0–0.1)
Basophils Relative: 0 %
Eosinophils Absolute: 0.1 10*3/uL (ref 0.0–0.7)
Eosinophils Relative: 1 %
HCT: 40 % (ref 39.0–52.0)
Hemoglobin: 12.8 g/dL — ABNORMAL LOW (ref 13.0–17.0)
Lymphocytes Relative: 30 %
Lymphs Abs: 1.6 10*3/uL (ref 0.7–4.0)
MCH: 27.4 pg (ref 26.0–34.0)
MCHC: 32 g/dL (ref 30.0–36.0)
MCV: 85.5 fL (ref 78.0–100.0)
Monocytes Absolute: 0.5 10*3/uL (ref 0.1–1.0)
Monocytes Relative: 10 %
Neutro Abs: 3.2 10*3/uL (ref 1.7–7.7)
Neutrophils Relative %: 59 %
Platelets: 211 10*3/uL (ref 150–400)
RBC: 4.68 MIL/uL (ref 4.22–5.81)
RDW: 14 % (ref 11.5–15.5)
WBC: 5.4 10*3/uL (ref 4.0–10.5)

## 2016-04-29 MED ORDER — MORPHINE SULFATE ER 30 MG PO TBCR
30.0000 mg | EXTENDED_RELEASE_TABLET | Freq: Two times a day (BID) | ORAL | Status: DC
  Administered 2016-04-29 – 2016-05-01 (×5): 30 mg via ORAL
  Filled 2016-04-29 (×5): qty 1

## 2016-04-29 MED ORDER — HYDROMORPHONE HCL 1 MG/ML IJ SOLN
1.0000 mg | Freq: Once | INTRAMUSCULAR | Status: AC
  Administered 2016-04-29: 1 mg via INTRAVENOUS

## 2016-04-29 NOTE — Progress Notes (Addendum)
HOSPITALIST DAILY PROGRESS NOTE Hospital Day: 8  Patient Name: Tony Walsh Admission Date/Time: 04/21/2016  3:49 AM  Age/Sex:24 y.o.male MRN#: [id number]  DOB: 11/12/1991 Attending Provider: Janann AugustLerissa A Rakisha Pincock, MD   LOS: 8 days   PCP/Outpatient Specialists: Patient Care Team: No Pcp Per Patient as PCP - General (General Practice)     Brief Narrative:  Tony Walsh is an 25 y.o. male the PMH of IV drug abuse admitted for treatment of a right forearm/antecubital fossa abscess with a retained foreign body s/p I&D and removal of foreign body (part of a needle) 04/21/16. Staying til weekend for appropriate wound care.    Assessment:   Active Hospital Problems   Diagnosis Date Noted  . Abscess of right forearm 04/21/2016  . Chronic hepatitis C (HCC) 04/22/2016  . Cocaine abuse with cocaine-induced mood disorder (HCC) 04/22/2016  . IV drug abuse 04/21/2016    Resolved Hospital Problems   Diagnosis Date Noted Date Resolved  No resolved problems to display.     Plan:  - appreciate Dr. Amanda PeaGramig, patient to stay for ongoing wound care/hydrotherapy, possible dc on Saturday.  - antibiotics de-escalated from vanco/zosyn to augmentin. Cultures without growth. Noted hydro notes with 2 areas of wound with pus/wet yellow tissue.  - Urine drug screen positive for opiates, cocaine and benzodiazepines. HIV nonreactive. Hepatitis C antibody positive. Discussed case with Dr. Ninetta LightsHatcher, studies he recommended are as follows: Ferritin 155, PT 14/INR 1.06, ANA negative, viral load 193,000 with a log of 5.286, genotype: 3. Will need outpatient ID follow-up.  - I had long discussion with him regarding his pain. Advised, that I believe that he does have pain and tolerance is high given his h/o Cocaine use/IVDA. I advised to him that potential for addiction is high with him and that is why we are having difficulty giving any further pain medication. He expresses that he does not want to remain on these  medications once his arm is healed. Advised that we are really doing what is best for him in the long run. I instructed that even though he may feel he doesn't want to be on them, the abuse potential and risk of addiction is very high with narcotics. Offered to use fentanyl patch, but he has heard bad things about it. We therefore will go with MS Contin. He is agreeable to plan of getting on long acting with hopes of decreasing use of PCA and eventually stopping PCA. He will get Dilaudid with wound care/hydrotherapy. Hopefully stop PCA by tomorrow afternoon, if there is still significant issue with pain, may need to get pain specialist.  - labs all stable.  I examined the patient and reviewed the chart, labs and data. I discussed the patient's status and plan of care with Patient and treatment team  DVT prophylaxis:  none due to patient being up and ambulatory  Code Status: Full Code Family Communication:   Girlfriend? Sleeping on couch.  Disposition Plan:  Hopefully discharge over weekend.   Consultants:   Dr. Amanda PeaGramig - ortho/hand surgery  Procedures:  Surgery 04/21/16 PROCEDURE PERFORMED: 1. Irrigation and debridement of skin, subcutaneous tissue, muscle,  and tendon. Excisional debridement with curette, knife, blade, and  scissor. This was an excisional debridement. 2. Removal of foreign body, right forearm, in the form of a needle  broken in nature off the syringe. 3. Fascial release and fasciotomy with fascial excision, right  forearm, dorsal aspect. 4. Radical extensor tenolysis, tenosynovectomy of the EDC and  __________ (APL  and EPB) musculotendinous regions about the wrist  and forearm. 5. Evacuation of deep abscess, right forearm.  2-D echo 04/23/16 Impressions: - Normal LV systolic and diastolic function; mild LAE; trace MR and TR.  Antimicrobials: Zosyn 04/21/16---> 04/26/16 Vancomycin 04/21/16---> 04/26/16 Augmentin 04/26/16--->  Subjective:  Patient  states that he's doing ok, but last night his pain was unbearable. States that because he ends up sleeping, he gets behind on pain control. He doesn't feel much difference when he does push the PCA.    Objective:  Temp:  [97.9 F (36.6 C)-98.1 F (36.7 C)] 97.9 F (36.6 C) (05/25 0425) Pulse Rate:  [80-87] 80 (05/25 0425) Resp:  [15-18] 18 (05/25 0855) BP: (129-130)/(54-68) 130/58 mmHg (05/25 0425) SpO2:  [97 %-100 %] 100 % (05/25 1035) SpO2 Readings from Last 3 Encounters:  04/29/16 100%  04/20/16 97%  04/20/16 98%    Intake/Output Summary (Last 24 hours) at 04/29/16 1352 Last data filed at 04/29/16 0645  Gross per 24 hour  Intake    120 ml  Output      0 ml  Net    120 ml   Filed Weights   04/21/16 1600  Weight: 108.863 kg (240 lb)   Body mass index is 29.23 kg/(m^2).   Physical Exam  Constitutional: He is oriented to person, place, and time and well-developed, well-nourished, and in no distress. No distress.  Cardiovascular: Normal rate, regular rhythm and normal heart sounds.   Pulmonary/Chest: Effort normal and breath sounds normal. No respiratory distress. He has no wheezes.  Abdominal: Soft. Bowel sounds are normal. He exhibits no distension. There is no tenderness.  Neurological: He is alert and oriented to person, place, and time. GCS score is 15.  Skin: Skin is warm and dry. He is not diaphoretic. No erythema.  Right arm dressed      Medications:  Scheduled Meds: . amoxicillin-clavulanate  1 tablet Oral Q12H  . bisacodyl  5 mg Oral Daily  . docusate sodium  100 mg Oral BID  . HYDROmorphone   Intravenous Q4H  . morphine  30 mg Oral Q12H  . polyethylene glycol  17 g Oral Daily   Continuous Infusions:  PRN Meds:  acetaminophen 650 mg Q6H PRN  diphenhydrAMINE 12.5 mg Q6H PRN  Or    diphenhydrAMINE 12.5 mg Q6H PRN  diphenhydrAMINE 25-50 mg Q6H PRN  famotidine 20 mg BID PRN  hydrALAZINE 10 mg Q4H PRN  HYDROmorphone (DILAUDID) injection 2 mg Daily PRN    ketorolac 30 mg Q6H PRN  methocarbamol 500 mg Q6H PRN  Or    methocarbamol (ROBAXIN)  IV 500 mg Q6H PRN  naloxone 0.4 mg PRN  And    sodium chloride flush 9 mL PRN  ondansetron (ZOFRAN) IV 4 mg Q6H PRN  traZODone 50 mg QHS PRN    Labs:  CBC:  Recent Labs Lab 04/29/16 0653  WBC 5.4  NEUTROABS 3.2  HGB 12.8*  HCT 40.0  MCV 85.5  PLT 211    CBG: No results for input(s): GLUCAP in the last 168 hours.  Basic Metabolic Panel:  Recent Labs Lab 04/24/16 1633 04/29/16 0653  GLUCOSE 117* 103*  NA 138 137  K 4.5 4.3  CL 103 106  CO2 26 25  BUN 10 13  CREATININE 1.01 1.01  CALCIUM 9.2 9.2    GFR: Estimated Creatinine Clearance: 149.9 mL/min (by C-G formula based on Cr of 1.01).  Liver Function Tests:  Recent Labs Lab 04/29/16 0653  AST 19  ALT 24  ALKPHOS 36*  BILITOT 0.4  PROT 7.0  ALBUMIN 3.5    Coagulation Profile:  Recent Labs Lab 04/22/16 1353  INR 1.06    Latest HbA1C/Lipids/Thyroid/Anemia: Lab Results  Component Value Date   FERRITIN 155 04/22/2016    Recent Results (from the past 240 hour(s))  Culture, blood (Routine X 2) w Reflex to ID Panel     Status: None   Collection Time: 04/21/16  2:12 AM  Result Value Ref Range Status   Specimen Description BLOOD RIGHT HAND  Final   Special Requests IN PEDIATRIC BOTTLE  Final   Culture NO GROWTH 5 DAYS  Final   Report Status 04/26/2016 FINAL  Final  Culture, blood (Routine X 2) w Reflex to ID Panel     Status: None   Collection Time: 04/21/16  2:20 AM  Result Value Ref Range Status   Specimen Description BLOOD LEFT HAND  Final   Special Requests BOTTLES DRAWN AEROBIC AND ANAEROBIC  Final   Culture NO GROWTH 5 DAYS  Final   Report Status 04/26/2016 FINAL  Final  Anaerobic culture     Status: None   Collection Time: 04/21/16  4:01 AM  Result Value Ref Range Status   Specimen Description ABSCESS  Final   Special Requests RIGHT FOREARM ABSCESS  Final   Gram Stain   Final     ABUNDANT WBC PRESENT, PREDOMINANTLY PMN NO SQUAMOUS EPITHELIAL CELLS SEEN ABUNDANT GRAM POSITIVE COCCI IN PAIRS ABUNDANT GRAM NEGATIVE RODS Performed at [facility]    Culture   Final    NO ANAEROBES ISOLATED Performed at [facility]    Report Status 04/25/2016 FINAL  Final  Culture, routine-abscess     Status: None   Collection Time: 04/21/16  4:01 AM  Result Value Ref Range Status   Specimen Description ABSCESS  Final   Special Requests RIGHT FOREARM ABSCESS  Final   Gram Stain   Final    ABUNDANT WBC PRESENT, PREDOMINANTLY PMN NO SQUAMOUS EPITHELIAL CELLS SEEN ABUNDANT GRAM POSITIVE COCCI IN PAIRS ABUNDANT GRAM NEGATIVE RODS Performed at [facility]    Culture   Final    MULTIPLE ORGANISMS PRESENT, NONE PREDOMINANT Note: NO STAPHYLOCOCCUS AUREUS ISOLATED NO GROUP A STREP (S.PYOGENES) ISOLATED Performed at [facility]    Report Status 04/25/2016 FINAL  Final  Tissue culture     Status: None   Collection Time: 04/21/16  4:01 AM  Result Value Ref Range Status   Specimen Description TISSUE  Final   Special Requests RIGHT FOREARM  Final   Gram Stain   Final    RARE WBC PRESENT, PREDOMINANTLY PMN NO ORGANISMS SEEN Performed at [facility]    Culture   Final    NO GROWTH 2 DAYS Performed at [facility]    Report Status 04/24/2016 FINAL  Final     Radiology Studies: No results found.  Time spent: 35 minutes  Janann August, MD [facility]  If 7PM-7AM, please contact night-coverage [email or web address] Password [email or web address] 04/29/2016, 1:52 PM

## 2016-04-29 NOTE — Progress Notes (Signed)
Physical Therapy Wound Treatment Patient Details  Name: ORYN CASANOVA MRN: [id number] Date of Birth: 1991-09-17  Today's Date: 04/29/2016 Time: 0926-1001 Time Calculation (min): 35 min  Subjective  Subjective: Pt agreeable to hydrotherapy after pain medication Patient and Family Stated Goals: heal wounds and save my arm Date of Onset:  (PTA) Prior Treatments: I&D   Pain Score: Pt premedicated prior to session.  Wound Assessment  Wound / Incision (Open or Dehisced) 04/22/16 Incision - Open Arm Right Forearm (Active)  Dressing Type Compression wrap;Gauze (Comment);Moist to dry;ABD 04/29/2016  1:03 PM  Dressing Changed Changed 04/28/2016 12:00 PM  Dressing Status Clean;Dry;Intact 04/29/2016  1:03 PM  Dressing Change Frequency Daily 04/29/2016  1:03 PM  Site / Wound Assessment Red;Yellow 04/29/2016  1:03 PM  % Wound base Red or Granulating 95% 04/29/2016  1:03 PM  % Wound base Yellow 5% 04/29/2016  1:03 PM  % Wound base Black 0% 04/29/2016  1:03 PM  % Wound base Other (Comment) 0% 04/29/2016  1:03 PM  Peri-wound Assessment Intact;Induration;Edema 04/29/2016  1:03 PM  Wound Length (cm) 9.5 cm 04/29/2016  1:03 PM  Wound Width (cm) 1.3 cm 04/29/2016  1:03 PM  Wound Depth (cm) 1.3 cm 04/29/2016  1:03 PM  Tunneling (cm) .2 at 7 o'clock 04/22/2016  2:00 PM  Margins Unattached edges (unapproximated) 04/29/2016  1:03 PM  Closure Sutures 04/29/2016  1:03 PM  Drainage Amount Copious 04/29/2016  1:03 PM  Drainage Description Serosanguineous;No odor 04/29/2016  1:03 PM  Non-staged Wound Description Full thickness 04/29/2016  1:03 PM  Treatment Debridement (Selective);Hydrotherapy (Pulse lavage);Packing (Saline gauze) 04/29/2016  1:03 PM  Hydrotherapy Pulsed lavage therapy - wound location: R arm Pulsed Lavage with Suction (psi): 8 psi (4 at times due to pain) Pulsed Lavage with Suction - Normal Saline Used: 1000 mL Pulsed Lavage Tip: Tip with splash shield Selective Debridement Selective Debridement -  Location: 2 most proximal areas of wound.  Selective Debridement - Tools Used: Forceps;Scissors Selective Debridement - Tissue Removed: Yellow tissue that appears to be surrounded by pus.   Wound Assessment and Plan  Wound Therapy - Assess/Plan/Recommendations Wound Therapy - Clinical Statement: Drainage appears to be improving. Noted what appeared to be pus/wet yellow tissue in the proximal 2 areas of the wound and was removed with scissors. Continues to benefit from hydrotherapy due to degree of infection Wound Therapy - Functional Problem List: Decreased strength due to pain.  Factors Delaying/Impairing Wound Healing: Substance abuse Hydrotherapy Plan: Dressing change;Debridement;Patient/family education;Pulsatile lavage with suction Wound Therapy - Frequency: 6X / week Wound Therapy - Follow Up Recommendations: Home health RN Wound Plan: See above  Wound Therapy Goals- Improve the function of patient's integumentary system by progressing the wound(s) through the phases of wound healing (inflammation - proliferation - remodeling) by: Patient/Family will be able to : manage self-dressing changes Patient/Family Instruction Goal - Progress: Progressing toward goal Additional Wound Therapy Goal: Demonstrate proper positioning and UE exercises to reduce edema Additional Wound Therapy Goal - Progress: Progressing toward goal Goals/treatment plan/discharge plan were made with and agreed upon by patient/family: Yes Time For Goal Achievement: 7 days Wound Therapy - Potential for Goals: Excellent  Goals will be updated until maximal potential achieved or discharge criteria met.  Discharge criteria: when goals achieved, discharge from hospital, MD decision/surgical intervention, no progress towards goals, refusal/missing three consecutive treatments without notification or medical reason.  GP     Rolinda Roan 04/29/2016, 1:20 PM   Rolinda Roan, PT, DPT Acute Rehabilitation Services Pager:  [phone]     

## 2016-04-29 NOTE — Care Management Note (Signed)
Case Management Note  Patient Details  Name: Tony Walsh MRN: [id number] Date of Birth: 02-24-1991  Subjective/Objective:     25 yr old male s/p I & D of right forearm abscess , patient is IV drug user and had needle embedded in skin.   Action/Plan: CM monitoring for discharge needs. Not candidate for PICC or IV antibiotics at home.    Expected Discharge Date:   to be determined.                Expected Discharge Plan:  Home/Self Care  In-House Referral:     Discharge planning Services     Post Acute Care Choice:  NA Choice offered to:     DME Arranged:  N/A DME Agency:  NA  HH Arranged:  NA HH Agency:  NA  Status of Service:  In process, will continue to follow  Medicare Important Message Given:    Date Medicare IM Given:    Medicare IM give by:    Date Additional Medicare IM Given:    Additional Medicare Important Message give by:     If discussed at Long Length of Stay Meetings, dates discussed:    Additional Comments:  Durenda GuthrieBrady, Noralyn Karim Naomi, RN 04/29/2016, 11:52 AM

## 2016-04-29 NOTE — Progress Notes (Signed)
Subjective: 8 Days Post-Op Procedure(s) (LRB): IRRIGATION AND DEBRIDEMENT EXTREMITY (Right) REMOVAL FOREIGN BODY EXTREMITY (Right) Patient reports pain as moderate.    Objective: Vital signs in last 24 hours: Temp:  [97.1 F (36.2 C)-98.1 F (36.7 C)] 97.1 F (36.2 C) (05/25 1413) Pulse Rate:  [80-87] 80 (05/25 1413) Resp:  [15-18] 16 (05/25 1553) BP: (126-130)/(58-73) 126/73 mmHg (05/25 1413) SpO2:  [95 %-100 %] 95 % (05/25 1553)  Intake/Output from previous day: 05/24 0701 - 05/25 0700 In: 360 [P.O.:360] Out: -  Intake/Output this shift: Total I/O In: 520 [P.O.:520] Out: -    Recent Labs  04/29/16 0653  HGB 12.8*    Recent Labs  04/29/16 0653  WBC 5.4  RBC 4.68  HCT 40.0  PLT 211    Recent Labs  04/29/16 0653  NA 137  K 4.3  CL 106  CO2 25  BUN 13  CREATININE 1.01  GLUCOSE 103*  CALCIUM 9.2   No results for input(s): LABPT, INR in the last 72 hours. Physical exam. Patient is stable. He is neurovascular intact he has full range of motion. The wound looks much better without urine Thieman cellulitis or other problems. I feel we can back off the aggressiveness of the wound being wicked as he is doing quite well. I dressed his wounds today. Neurologically intact ABD soft Neurovascular intact Sensation intact distally Intact pulses distally No cellulitis present Compartment soft  Assessment/Plan: 8 Days Post-Op Procedure(s) (LRB): IRRIGATION AND DEBRIDEMENT EXTREMITY (Right) REMOVAL FOREIGN BODY EXTREMITY (Right) We will continue elevation and hydrotherapy. We can be less aggressive with wicking the wound and let the soft tissues stick down a bit. I changed his dressing at bedside today.  He is in no hurry to leave the hospital it appears until the wound care is a stable situation for him.  I've asked him to try and curtail his narcotic use.  All issues have been discussed.  Karen ChafeGRAMIG III,Apollonia Amini M 04/29/2016,

## 2016-04-29 NOTE — Progress Notes (Signed)
Pt states had a BM today.  Time unknown.

## 2016-04-29 NOTE — Progress Notes (Signed)
Pt stated he was going for a walk at 1104.  Patient left the unit with a male and has not returned yet to receive initial dose of ms contin.

## 2016-04-30 DIAGNOSIS — L02413 Cutaneous abscess of right upper limb: Principal | ICD-10-CM

## 2016-04-30 NOTE — Progress Notes (Signed)
Patient ID: Tony Walsh, male   DOB: 1991-12-02, [age] y.o.   MRN: [id number] Patient is doing well. Patient has no signs of infection. I discussed all issues with patient and his therapist at bedside.  [facility]ik/hydrotherapy is being continued. We're going to back off the aggressiveness of packing in wounds as I feel he is ready to be sealed down. Hopefully over the next 2-5 days we can transition him to wet-to-dry dressing application that he could perform on his own accord.  He is very reluctant to engage this process but ultimately he is going to have to engage some degree of his own wound care and I discussed this with him.  I think that hopefully with good wound care he should be ready for closure in the next 2-3 weeks.  He has a fairly good understanding of the process I feel after multiple discussions.  All questions have been addressed  I would recommend Augmentin for 3 weeks 875 twice a day  Llewellyn Choplin M.D.

## 2016-04-30 NOTE — Progress Notes (Signed)
Physical Therapy Wound Treatment Patient Details  Name: Tony Walsh MRN: [id number] Date of Birth: 11/08/1991  Today's Date: 04/30/2016 Time: 1021-1043 Time Calculation (min): 22 min  Subjective  Subjective: Pt agreeable to hydrotherapy after pain medication Patient and Family Stated Goals: heal wounds and save my arm Date of Onset:  (PTA) Prior Treatments: I&D   Pain Score: Pt premedicated prior to treatment  Wound Assessment  Wound / Incision (Open or Dehisced) 04/22/16 Incision - Open Arm Right Forearm (Active)  Dressing Type Compression wrap;Gauze (Comment);Moist to dry;ABD 04/30/2016  1:12 PM  Dressing Changed Changed 04/30/2016  1:12 PM  Dressing Status Clean;Dry;Intact 04/30/2016  1:12 PM  Dressing Change Frequency Daily 04/30/2016  1:12 PM  Site / Wound Assessment Red;Yellow 04/30/2016  1:12 PM  % Wound base Red or Granulating 95% 04/30/2016  1:12 PM  % Wound base Yellow 5% 04/30/2016  1:12 PM  % Wound base Black 0% 04/30/2016  1:12 PM  % Wound base Other (Comment) 0% 04/30/2016  1:12 PM  Peri-wound Assessment Intact;Induration;Edema 04/30/2016  1:12 PM  Wound Length (cm) 9.5 cm 04/29/2016  1:03 PM  Wound Width (cm) 1.3 cm 04/29/2016  1:03 PM  Wound Depth (cm) 1.3 cm 04/29/2016  1:03 PM  Tunneling (cm) .2 at 7 o'clock 04/22/2016  2:00 PM  Undermining (cm) 2.9 cm at 3 o'clock most proximally, 2.8 cm at 2-3 o'clock, 2.6 cm at 1 o'clock. 04/30/2016  1:12 PM  Margins Unattached edges (unapproximated) 04/30/2016  1:12 PM  Closure Sutures 04/30/2016  1:12 PM  Drainage Amount Moderate 04/30/2016  1:12 PM  Drainage Description Serosanguineous;No odor 04/30/2016  1:12 PM  Non-staged Wound Description Full thickness 04/30/2016  1:12 PM  Treatment Hydrotherapy (Pulse lavage);Packing (Saline gauze) 04/30/2016  1:12 PM   Hydrotherapy Pulsed lavage therapy - wound location: R arm Pulsed Lavage with Suction (psi): 8 psi (4 at times due to pain) Pulsed Lavage with Suction - Normal Saline Used: 1000  mL Pulsed Lavage Tip: Tip with splash shield   Wound Assessment and Plan  Wound Therapy - Assess/Plan/Recommendations Wound Therapy - Clinical Statement: Drainage appears to be improving. Continues to benefit from hydrotherapy due to degree of infection. Per conversation with MD today, will plan for hydro saturday and sunday with the goal of d/c sunday. Wound Therapy - Functional Problem List: Decreased strength due to pain.  Factors Delaying/Impairing Wound Healing: Substance abuse Hydrotherapy Plan: Dressing change;Debridement;Patient/family education;Pulsatile lavage with suction Wound Therapy - Frequency: 6X / week Wound Therapy - Follow Up Recommendations: Home health RN Wound Plan: See above  Wound Therapy Goals- Improve the function of patient's integumentary system by progressing the wound(s) through the phases of wound healing (inflammation - proliferation - remodeling) by: Patient/Family will be able to : manage self-dressing changes Patient/Family Instruction Goal - Progress: Progressing toward goal Additional Wound Therapy Goal: Demonstrate proper positioning and UE exercises to reduce edema Additional Wound Therapy Goal - Progress: Progressing toward goal Goals/treatment plan/discharge plan were made with and agreed upon by patient/family: Yes Time For Goal Achievement: 7 days Wound Therapy - Potential for Goals: Excellent  Goals will be updated until maximal potential achieved or discharge criteria met.  Discharge criteria: when goals achieved, discharge from hospital, MD decision/surgical intervention, no progress towards goals, refusal/missing three consecutive treatments without notification or medical reason.  GP     Rolinda Roan 04/30/2016, 1:23 PM   Rolinda Roan, PT, DPT Acute Rehabilitation Services Pager: [phone]

## 2016-04-30 NOTE — Progress Notes (Signed)
PROGRESS NOTE    Tony Walsh  ZOX:[id number] DOB: [birth year] DOA: 04/21/2016 PCP: No PCP Per Patient   Brief Narrative:  Tony Walsh is an 25 y.o. male the PMH of IV drug abuse admitted for treatment of a right forearm/antecubital fossa abscess with a retained foreign body s/p I&D and removal of foreign body (part of a needle) 04/21/16. Staying til weekend for appropriate wound care.   Assessment & Plan:   Principal Problem:   Abscess of right forearm - Ortho managing - defer pain management to ortho given recent procedure - continue augmentin  Active Problems:   IV drug abuse - recommend cessation    Chronic hepatitis C (HCC) -  stable    Cocaine abuse with cocaine-induced mood disorder (HCC) - Stable we'll have to monitor this times patient seems aggressive   DVT prophylaxis: Early ambulation Code Status: Full Family Communication: Discussed directly with patient Disposition Plan: Pending orthopedic surgeons recommendations   Consultants:   Orthopedic surgery   Procedures: Please refer to ortho's notes   Antimicrobials: Augmentin   Subjective: Pt has no new complaints  Objective: Filed Vitals:   04/30/16 0800 04/30/16 1341 04/30/16 1402 04/30/16 1804  BP:   149/66   Pulse:   79   Temp:   97.9 F (36.6 C)   TempSrc:   Oral   Resp: 14 16 16 13   Height:      Weight:      SpO2: 97% 97% 98% 97%    Intake/Output Summary (Last 24 hours) at 04/30/16 1810 Last data filed at 04/30/16 1609  Gross per 24 hour  Intake   1220 ml  Output      0 ml  Net   1220 ml   Filed Weights   04/21/16 1600  Weight: 108.863 kg (240 lb)    Examination:  General exam: Appears calm and comfortable  Respiratory system: no increased wob. Respiratory effort normal. Cardiovascular system: no cyanosis, or edema Gastrointestinal system: Abdomen is nondistended, no guarding Central nervous system: Alert and oriented. No focal neurological deficits. Extremities:  Symmetric 5 x 5 power. Skin: R forearm bandage in place Psychiatry: Judgement and insight appear normal. Mood & affect appropriate.     Data Reviewed: I have personally reviewed following labs and imaging studies  CBC:  Recent Labs Lab 04/29/16 0653  WBC 5.4  NEUTROABS 3.2  HGB 12.8*  HCT 40.0  MCV 85.5  PLT 211   Basic Metabolic Panel:  Recent Labs Lab 04/24/16 1633 04/29/16 0653  NA 138 137  K 4.5 4.3  CL 103 106  CO2 26 25  GLUCOSE 117* 103*  BUN 10 13  CREATININE 1.01 1.01  CALCIUM 9.2 9.2   GFR: Estimated Creatinine Clearance: 149.9 mL/min (by C-G formula based on Cr of 1.01). Liver Function Tests:  Recent Labs Lab 04/29/16 0653  AST 19  ALT 24  ALKPHOS 36*  BILITOT 0.4  PROT 7.0  ALBUMIN 3.5   No results for input(s): LIPASE, AMYLASE in the last 168 hours. No results for input(s): AMMONIA in the last 168 hours. Coagulation Profile: No results for input(s): INR, PROTIME in the last 168 hours. Cardiac Enzymes: No results for input(s): CKTOTAL, CKMB, CKMBINDEX, TROPONINI in the last 168 hours. BNP (last 3 results) No results for input(s): PROBNP in the last 8760 hours. HbA1C: No results for input(s): HGBA1C in the last 72 hours. CBG: No results for input(s): GLUCAP in the last 168 hours. Lipid Profile: No results for  input(s): CHOL, HDL, LDLCALC, TRIG, CHOLHDL, LDLDIRECT in the last 72 hours. Thyroid Function Tests: No results for input(s): TSH, T4TOTAL, FREET4, T3FREE, THYROIDAB in the last 72 hours. Anemia Panel: No results for input(s): VITAMINB12, FOLATE, FERRITIN, TIBC, IRON, RETICCTPCT in the last 72 hours. Sepsis Labs: No results for input(s): PROCALCITON, LATICACIDVEN in the last 168 hours.  Recent Results (from the past 240 hour(s))  Culture, blood (Routine X 2) w Reflex to ID Panel     Status: None   Collection Time: 04/21/16  2:12 AM  Result Value Ref Range Status   Specimen Description BLOOD RIGHT HAND  Final   Special Requests  IN PEDIATRIC BOTTLE  Final   Culture NO GROWTH 5 DAYS  Final   Report Status 04/26/2016 FINAL  Final  Culture, blood (Routine X 2) w Reflex to ID Panel     Status: None   Collection Time: 04/21/16  2:20 AM  Result Value Ref Range Status   Specimen Description BLOOD LEFT HAND  Final   Special Requests BOTTLES DRAWN AEROBIC AND ANAEROBIC  Final   Culture NO GROWTH 5 DAYS  Final   Report Status 04/26/2016 FINAL  Final  Anaerobic culture     Status: None   Collection Time: 04/21/16  4:01 AM  Result Value Ref Range Status   Specimen Description ABSCESS  Final   Special Requests RIGHT FOREARM ABSCESS  Final   Gram Stain   Final    ABUNDANT WBC PRESENT, PREDOMINANTLY PMN NO SQUAMOUS EPITHELIAL CELLS SEEN ABUNDANT GRAM POSITIVE COCCI IN PAIRS ABUNDANT GRAM NEGATIVE RODS Performed at [facility]    Culture   Final    NO ANAEROBES ISOLATED Performed at [facility]    Report Status 04/25/2016 FINAL  Final  Culture, routine-abscess     Status: None   Collection Time: 04/21/16  4:01 AM  Result Value Ref Range Status   Specimen Description ABSCESS  Final   Special Requests RIGHT FOREARM ABSCESS  Final   Gram Stain   Final    ABUNDANT WBC PRESENT, PREDOMINANTLY PMN NO SQUAMOUS EPITHELIAL CELLS SEEN ABUNDANT GRAM POSITIVE COCCI IN PAIRS ABUNDANT GRAM NEGATIVE RODS Performed at [facility]    Culture   Final    MULTIPLE ORGANISMS PRESENT, NONE PREDOMINANT Note: NO STAPHYLOCOCCUS AUREUS ISOLATED NO GROUP A STREP (S.PYOGENES) ISOLATED Performed at [facility]    Report Status 04/25/2016 FINAL  Final  Tissue culture     Status: None   Collection Time: 04/21/16  4:01 AM  Result Value Ref Range Status   Specimen Description TISSUE  Final   Special Requests RIGHT FOREARM  Final   Gram Stain   Final    RARE WBC PRESENT, PREDOMINANTLY PMN NO ORGANISMS SEEN Performed at [facility]    Culture   Final    NO GROWTH 2 DAYS Performed at  [facility]    Report Status 04/24/2016 FINAL  Final      Radiology Studies: No results found.  Scheduled Meds: . amoxicillin-clavulanate  1 tablet Oral Q12H  . bisacodyl  5 mg Oral Daily  . docusate sodium  100 mg Oral BID  . HYDROmorphone   Intravenous Q4H  . morphine  30 mg Oral Q12H  . polyethylene glycol  17 g Oral Daily   Continuous Infusions:    LOS: 9 days    Time spent: > 35 minutes    Penny Pia, MD [facility] Pager [phone]  If  7PM-7AM, please contact night-coverage [email or web address] Password [email or web address] 04/30/2016, 6:10 PM

## 2016-05-01 MED ORDER — HYDROMORPHONE HCL 2 MG PO TABS
4.0000 mg | ORAL_TABLET | ORAL | Status: DC | PRN
  Administered 2016-05-01 – 2016-05-04 (×11): 4 mg via ORAL
  Filled 2016-05-01 (×12): qty 2

## 2016-05-01 MED ORDER — OXYCODONE HCL 5 MG PO TABS: 5.0000 mg | ORAL_TABLET | ORAL | Status: DC | PRN

## 2016-05-01 MED ORDER — MORPHINE SULFATE ER 15 MG PO TBCR
15.0000 mg | EXTENDED_RELEASE_TABLET | Freq: Two times a day (BID) | ORAL | Status: DC
  Administered 2016-05-01: 15 mg via ORAL
  Filled 2016-05-01: qty 1

## 2016-05-01 MED ORDER — AMOXICILLIN-POT CLAVULANATE 875-125 MG PO TABS: 1.0000 | ORAL_TABLET | Freq: Two times a day (BID) | ORAL | Status: DC

## 2016-05-01 MED ORDER — ACETAMINOPHEN 325 MG PO TABS: 650.0000 mg | ORAL_TABLET | Freq: Four times a day (QID) | ORAL | Status: DC | PRN

## 2016-05-01 MED ORDER — MORPHINE SULFATE (PF) 2 MG/ML IV SOLN
2.0000 mg | Freq: Once | INTRAVENOUS | Status: AC
Start: 2016-05-01 — End: 2016-05-01
  Administered 2016-05-01: 2 mg via INTRAVENOUS
  Filled 2016-05-01: qty 1

## 2016-05-01 NOTE — Progress Notes (Signed)
Patient ID: Tony Walsh, male   DOB: [birth year], 25 y.o.   MRN: [id number] Patient is seen and examined  Patient is doing quite well with activity and use of his right arm.  His had whirlpool today. We are decreasing the amount of wicking that is being performed on the arm.  He has no signs of worsening infection or dystrophy.  He is on Augmentin as outlined.  At present time I feel that he is made ample gains. Unfortunately he does not have resources for the whirlpool outside of the hospital and thus he is had an extended stay for wound care management.  I discussed with the patient that I would recommend DC PCA at this point and go to oral medications. We had a very long detailed and in-depth conversation about this. He is acceptable to DC in the IV pain medicine except for when he has his whirlpools and moving forward with by mouth pain management  I will DC the PCA and place him on the allotted by mouth tablets.  We will continue dressing changes.  The patient and I discussed all issues.  I feel he is making good progress.  I discussed with nursing staff.  Lashell Moffitt M.D.

## 2016-05-01 NOTE — Progress Notes (Signed)
PROGRESS NOTE    Tony Walsh  [id number] DOB: 1991/10/28 DOA: 04/21/2016 PCP: No PCP Per Patient   Brief Narrative:  Tony Walsh is an 25 y.o. male the PMH of IV drug abuse admitted for treatment of a right forearm/antecubital fossa abscess with a retained foreign body s/p I&D and removal of foreign body (part of a needle) 04/21/16. Staying til weekend for appropriate wound care.   Assessment & Plan:   Principal Problem:   Abscess of right forearm - Ortho managing - defer pain management to ortho given recent procedure - continue augmentin will need 3 more weeks  Active Problems:   IV drug abuse - recommend cessation    Chronic hepatitis C (HCC) -  stable    Cocaine abuse with cocaine-induced mood disorder (HCC) - Stable we'll have to monitor this times patient seems aggressive   DVT prophylaxis: Early ambulation Code Status: Full Family Communication: Discussed directly with patient Disposition Plan: Will discuss with Ortho to see if they can take over as Primary since principle problem is an Ortho one affecting disposition   Consultants:   Orthopedic surgery   Procedures: Please refer to ortho's notes   Antimicrobials: Augmentin   Subjective: Pt has no new complaints  Objective: Filed Vitals:   05/01/16 0500 05/01/16 0821 05/01/16 1121 05/01/16 1457  BP: 131/62   128/82  Pulse: 87   88  Temp: 97.6 F (36.4 C)   97.1 F (36.2 C)  TempSrc: Oral   Oral  Resp: 18 16 20 18   Height:      Weight:      SpO2: 99% 99% 100% 100%    Intake/Output Summary (Last 24 hours) at 05/01/16 1612 Last data filed at 05/01/16 1300  Gross per 24 hour  Intake    540 ml  Output      0 ml  Net    540 ml   Filed Weights   04/21/16 1600  Weight: 108.863 kg (240 lb)    Examination:  General exam: Appears calm and comfortable  Respiratory system: no increased wob. Respiratory effort normal. Cardiovascular system: no cyanosis, or edema Gastrointestinal  system: Abdomen is nondistended, no guarding Central nervous system: Alert and oriented. No focal neurological deficits. Extremities: Symmetric 5 x 5 power. Skin: R forearm bandage in place Psychiatry: Judgement and insight appear normal. Mood & affect appropriate.     Data Reviewed: I have personally reviewed following labs and imaging studies  CBC:  Recent Labs Lab 04/29/16 0653  WBC 5.4  NEUTROABS 3.2  HGB 12.8*  HCT 40.0  MCV 85.5  PLT 211   Basic Metabolic Panel:  Recent Labs Lab 04/24/16 1633 04/29/16 0653  NA 138 137  K 4.5 4.3  CL 103 106  CO2 26 25  GLUCOSE 117* 103*  BUN 10 13  CREATININE 1.01 1.01  CALCIUM 9.2 9.2   GFR: Estimated Creatinine Clearance: 149.9 mL/min (by C-G formula based on Cr of 1.01). Liver Function Tests:  Recent Labs Lab 04/29/16 0653  AST 19  ALT 24  ALKPHOS 36*  BILITOT 0.4  PROT 7.0  ALBUMIN 3.5   No results for input(s): LIPASE, AMYLASE in the last 168 hours. No results for input(s): AMMONIA in the last 168 hours. Coagulation Profile: No results for input(s): INR, PROTIME in the last 168 hours. Cardiac Enzymes: No results for input(s): CKTOTAL, CKMB, CKMBINDEX, TROPONINI in the last 168 hours. BNP (last 3 results) No results for input(s): PROBNP in the last 8760  hours. HbA1C: No results for input(s): HGBA1C in the last 72 hours. CBG: No results for input(s): GLUCAP in the last 168 hours. Lipid Profile: No results for input(s): CHOL, HDL, LDLCALC, TRIG, CHOLHDL, LDLDIRECT in the last 72 hours. Thyroid Function Tests: No results for input(s): TSH, T4TOTAL, FREET4, T3FREE, THYROIDAB in the last 72 hours. Anemia Panel: No results for input(s): VITAMINB12, FOLATE, FERRITIN, TIBC, IRON, RETICCTPCT in the last 72 hours. Sepsis Labs: No results for input(s): PROCALCITON, LATICACIDVEN in the last 168 hours.  No results found for this or any previous visit (from the past 240 hour(s)).    Radiology Studies: No results  found.  Scheduled Meds: . amoxicillin-clavulanate  1 tablet Oral Q12H  . bisacodyl  5 mg Oral Daily  . docusate sodium  100 mg Oral BID  . morphine  15 mg Oral Q12H  . polyethylene glycol  17 g Oral Daily   Continuous Infusions:    LOS: 10 days    Time spent: > 35 minutes    Penny Pia, MD [facility] Pager [phone]  If 7PM-7AM, please contact night-coverage [email or web address] Password [email or web address] 05/01/2016, 4:12 PM

## 2016-05-01 NOTE — Progress Notes (Signed)
Physical Therapy Wound Treatment Patient Details  Name: Tony Walsh MRN: [id number] Date of Birth: March 11, 1991  Today's Date: 05/01/2016 Time: 5615-3794 Time Calculation (min): 24 min  Subjective  Subjective: Pt agreeable to hydrotherapy after pain medication Patient and Family Stated Goals: heal wounds and save my arm Date of Onset:  (PTA) Prior Treatments: I&D   Pain Score: Pt was premedicated prior to session.   Wound Assessment  Wound / Incision (Open or Dehisced) 04/22/16 Incision - Open Arm Right Forearm (Active)  Dressing Type Compression wrap;Gauze (Comment);Moist to dry;ABD 05/01/2016  8:39 AM  Dressing Changed Changed 05/01/2016  8:39 AM  Dressing Status Clean;Dry;Intact 05/01/2016  8:39 AM  Dressing Change Frequency Daily 05/01/2016  8:39 AM  Site / Wound Assessment Red;Yellow 05/01/2016  8:39 AM  % Wound base Red or Granulating 95% 05/01/2016  8:39 AM  % Wound base Yellow 5% 05/01/2016  8:39 AM  % Wound base Black 0% 05/01/2016  8:39 AM  % Wound base Other (Comment) 0% 05/01/2016  8:39 AM  Peri-wound Assessment Intact;Induration;Edema 05/01/2016  8:39 AM  Wound Length (cm) 9.5 cm 04/29/2016  1:03 PM  Wound Width (cm) 1.3 cm 04/29/2016  1:03 PM  Wound Depth (cm) 1.3 cm 04/29/2016  1:03 PM  Tunneling (cm) .2 at 7 o'clock 04/22/2016  2:00 PM  Undermining (cm) 2.9 cm at 3 o'clock most proximally, 2.8 cm at 2-3 o'clock, 2.6 cm at 1 o'clock. 04/30/2016  1:12 PM  Margins Unattached edges (unapproximated) 05/01/2016  8:39 AM  Closure Sutures 05/01/2016  8:39 AM  Drainage Amount Moderate 05/01/2016  8:39 AM  Drainage Description Serosanguineous;No odor 05/01/2016  8:39 AM  Non-staged Wound Description Full thickness 05/01/2016  8:39 AM  Treatment Hydrotherapy (Pulse lavage);Packing (Saline gauze) 05/01/2016  8:39 AM   Hydrotherapy Pulsed lavage therapy - wound location: R arm Pulsed Lavage with Suction (psi): 8 psi (4 at times due to pain) Pulsed Lavage with Suction - Normal Saline Used:  1000 mL Pulsed Lavage Tip: Tip with splash shield   Wound Assessment and Plan  Wound Therapy - Assess/Plan/Recommendations Wound Therapy - Clinical Statement: Drainage appears to be improving. Continues to benefit from hydrotherapy due to degree of infection. Per conversation with MD, will plan for hydro sunday with the goal of d/c sunday. Wound Therapy - Functional Problem List: Decreased strength due to pain.  Factors Delaying/Impairing Wound Healing: Substance abuse Hydrotherapy Plan: Dressing change;Debridement;Patient/family education;Pulsatile lavage with suction Wound Therapy - Frequency: 6X / week Wound Therapy - Follow Up Recommendations: Home health RN Wound Plan: See above  Wound Therapy Goals- Improve the function of patient's integumentary system by progressing the wound(s) through the phases of wound healing (inflammation - proliferation - remodeling) by: Patient/Family will be able to : manage self-dressing changes Patient/Family Instruction Goal - Progress: Progressing toward goal Additional Wound Therapy Goal: Demonstrate proper positioning and UE exercises to reduce edema Additional Wound Therapy Goal - Progress: Progressing toward goal Goals/treatment plan/discharge plan were made with and agreed upon by patient/family: Yes Time For Goal Achievement: 7 days Wound Therapy - Potential for Goals: Excellent  Goals will be updated until maximal potential achieved or discharge criteria met.  Discharge criteria: when goals achieved, discharge from hospital, MD decision/surgical intervention, no progress towards goals, refusal/missing three consecutive treatments without notification or medical reason.  GP     Rolinda Roan 05/01/2016, 8:44 AM   Rolinda Roan, PT, DPT [facility] Pager: [phone]

## 2016-05-02 NOTE — Progress Notes (Addendum)
Pt insisting that Dr. Amanda PeaGramig ordered Dilaudid for tonight. Notified PA Bissell. She said she did not know pt and Dr. Amanda PeaGramig would have to discuss meds with pt, but for now, pt is to be treated with oral pain meds. Pt has had many people in room partying and has walked in hall. Does not appear to be in pain. Dr. Jillyn HiddenBean responded to page. He agreed to have pt treated with only pain meds. Did order one mg of IV dilaudid if pt caused any problems. Pt was cooperative and all po meds given. He agreed to wait for IV dilaudid in the morning prior to hydrotherapy.

## 2016-05-02 NOTE — Progress Notes (Signed)
Patient ID: Tony Walsh, male   DOB: 07-05-1991, 25 y.o.   MRN: [id number] Patient seen at bedside.  I undid the dressing and discussed with him the wound care plan. I have taught him wet-to-dry dressing changes at bedside.  He has 3-4 areas which will require wet-to-dry dressing changes. These are dime-sized areas which will healing by secondary intention.  Procedure I performed irrigation and debridement of skin's obtained tissue excisional nature with curet and scissor tip. He tolerated this well. He was packed with simple wet-to-dry dressing change. He can begin this tomorrow and I will supervise his dressing tomorrow.  I have weaned him off of his PCA.  Despite being very anxious about this he did quite well. I have also decreased his MS Contin dosage and will DC this completely today. This was previously prescribed by the hospitalist He will continue the by mouth dilaudid.  I discussed these findings and my plans with nursing staff.  He will be discharged Tuesday morning. I discussed and these issues. Will plan for suture removal and transition to wet-to-dry dressings Monday and Tuesday with Tuesday morning been his date of discharge.  The patient would like to stay in the hospital for an extended period of time however I discussed him I feel he is very capable and competent to do this on his own. We've had a long discussion about this today.  Overall think he is making good confidence gains and should be ready for Tuesday morning DC  He'll begin his seated home on Augmentin 3 weeks and Dilaudid by mouth  All questions have been encouraged and answered   Sarh Kirschenbaum M.D.

## 2016-05-02 NOTE — Progress Notes (Signed)
Physical Therapy Wound Treatment Patient Details  Name: Tony Walsh MRN: [id number] Date of Birth: 02/16/1991  Today's Date: 05/02/2016 Time: 3329-5188 Time Calculation (min): 24 min  Subjective  Subjective: Pt requested he receive pain medicine after we were done with hydrotherapy. Patient and Family Stated Goals: heal wounds and save my arm Date of Onset:  (PTA) Prior Treatments: I&D   Pain Score:  Pt requested pain meds after treatment today.  Wound Assessment  Wound / Incision (Open or Dehisced) 04/22/16 Incision - Open Arm Right Forearm (Active)  Dressing Type Compression wrap;Gauze (Comment);Moist to dry;ABD 05/02/2016  8:55 AM  Dressing Changed Changed 05/02/2016  8:55 AM  Dressing Status Clean;Dry;Intact 05/02/2016  8:55 AM  Dressing Change Frequency Daily 05/02/2016  8:55 AM  Site / Wound Assessment Red;Yellow 05/02/2016  8:55 AM  % Wound base Red or Granulating 95% 05/02/2016  8:55 AM  % Wound base Yellow 5% 05/02/2016  8:55 AM  % Wound base Black 0% 05/02/2016  8:55 AM  % Wound base Other (Comment) 0% 05/02/2016  8:55 AM  Peri-wound Assessment Intact;Edema 05/02/2016  8:55 AM  Wound Length (cm) 9.5 cm 04/29/2016  1:03 PM  Wound Width (cm) 1.3 cm 04/29/2016  1:03 PM  Wound Depth (cm) 1.3 cm 04/29/2016  1:03 PM  Tunneling (cm) .2 at 7 o'clock 04/22/2016  2:00 PM  Undermining (cm) 2.9 cm at 3 o'clock most proximally, 2.8 cm at 2-3 o'clock, 2.6 cm at 1 o'clock. 04/30/2016  1:12 PM  Margins Unattached edges (unapproximated) 05/02/2016  8:55 AM  Closure Sutures 05/02/2016  8:55 AM  Drainage Amount Minimal 05/02/2016  8:55 AM  Drainage Description Serosanguineous;No odor 05/02/2016  8:55 AM  Non-staged Wound Description Full thickness 05/01/2016  8:39 AM  Treatment Hydrotherapy (Pulse lavage);Packing (Saline gauze) 05/02/2016  8:55 AM      Hydrotherapy Pulsed lavage therapy - wound location: Rt forearm Pulsed Lavage with Suction (psi): 8 psi (4-8) Pulsed Lavage with Suction - Normal  Saline Used: 1000 mL Pulsed Lavage Tip: Tip with splash shield   Wound Assessment and Plan  Wound Therapy - Assess/Plan/Recommendations Wound Therapy - Clinical Statement: Pt tolerated well.  Wound Therapy - Functional Problem List: Decreased strength due to pain.  Factors Delaying/Impairing Wound Healing: Substance abuse Hydrotherapy Plan: Dressing change;Debridement;Patient/family education;Pulsatile lavage with suction Wound Therapy - Frequency: 6X / week Wound Therapy - Follow Up Recommendations: Home health RN Wound Plan: See above  Wound Therapy Goals- Improve the function of patient's integumentary system by progressing the wound(s) through the phases of wound healing (inflammation - proliferation - remodeling) by: Patient/Family will be able to : manage self-dressing changes Patient/Family Instruction Goal - Progress: Progressing toward goal Additional Wound Therapy Goal: Demonstrate proper positioning and UE exercises to reduce edema Additional Wound Therapy Goal - Progress: Progressing toward goal Goals/treatment plan/discharge plan were made with and agreed upon by patient/family: Yes Time For Goal Achievement: 7 days Wound Therapy - Potential for Goals: Excellent  Goals will be updated until maximal potential achieved or discharge criteria met.  Discharge criteria: when goals achieved, discharge from hospital, MD decision/surgical intervention, no progress towards goals, refusal/missing three consecutive treatments without notification or medical reason.  GP     [name] 05/02/2016, 11:59 AM Suanne Marker PT [phone]

## 2016-05-02 NOTE — Progress Notes (Signed)
PROGRESS NOTE    Tony Walsh  FAO:[id number] DOB: [birth year] DOA: 04/21/2016 PCP: No PCP Per Patient   Brief Narrative:  Tony Walsh is an 25 y.o. male the PMH of IV drug abuse admitted for treatment of a right forearm/antecubital fossa abscess with a retained foreign body s/p I&D and removal of foreign body (part of a needle) 04/21/16. Staying til weekend for appropriate wound care.   Assessment & Plan:   Principal Problem:   Abscess of right forearm - Ortho managing - continue supportive therapy. Patient reports his pain is currently controlled. - continue augmentin will need 3 more weeks  Active Problems:   IV drug abuse - recommend cessation    Chronic hepatitis C (HCC) -  stable    Cocaine abuse with cocaine-induced mood disorder (HCC) - Stable we'll have to monitor this times patient seems aggressive   DVT prophylaxis: Early ambulation Code Status: Full Family Communication: Discussed directly with patient Disposition Plan: once ready for d/c by ortho   Consultants:   Orthopedic surgery   Procedures: Please refer to ortho's notes   Antimicrobials: Augmentin   Subjective: Pt has no new complaints  Objective: Filed Vitals:   05/01/16 1121 05/01/16 1457 05/01/16 2134 05/02/16 0641  BP:  128/82 135/65 120/60  Pulse:  88 82 66  Temp:  97.1 F (36.2 C) 98 F (36.7 C) 97.6 F (36.4 C)  TempSrc:  Oral Oral Oral  Resp: 20 18 18 16   Height:      Weight:      SpO2: 100% 100% 98% 100%    Intake/Output Summary (Last 24 hours) at 05/02/16 1439 Last data filed at 05/02/16 1322  Gross per 24 hour  Intake    120 ml  Output      0 ml  Net    120 ml   Filed Weights   04/21/16 1600  Weight: 108.863 kg (240 lb)    Examination:  General exam: Appears calm and comfortable  Respiratory system: no increased wob. Respiratory effort normal. Cardiovascular system: no cyanosis, or edema Gastrointestinal system: Abdomen is nondistended, no  guarding Central nervous system: Alert and oriented. No focal neurological deficits. Extremities: Symmetric 5 x 5 power. Skin: R forearm bandage in place Psychiatry: Judgement and insight appear normal. Mood & affect appropriate.     Data Reviewed: I have personally reviewed following labs and imaging studies  CBC:  Recent Labs Lab 04/29/16 0653  WBC 5.4  NEUTROABS 3.2  HGB 12.8*  HCT 40.0  MCV 85.5  PLT 211   Basic Metabolic Panel:  Recent Labs Lab 04/29/16 0653  NA 137  K 4.3  CL 106  CO2 25  GLUCOSE 103*  BUN 13  CREATININE 1.01  CALCIUM 9.2   GFR: Estimated Creatinine Clearance: 149.9 mL/min (by C-G formula based on Cr of 1.01). Liver Function Tests:  Recent Labs Lab 04/29/16 0653  AST 19  ALT 24  ALKPHOS 36*  BILITOT 0.4  PROT 7.0  ALBUMIN 3.5   No results for input(s): LIPASE, AMYLASE in the last 168 hours. No results for input(s): AMMONIA in the last 168 hours. Coagulation Profile: No results for input(s): INR, PROTIME in the last 168 hours. Cardiac Enzymes: No results for input(s): CKTOTAL, CKMB, CKMBINDEX, TROPONINI in the last 168 hours. BNP (last 3 results) No results for input(s): PROBNP in the last 8760 hours. HbA1C: No results for input(s): HGBA1C in the last 72 hours. CBG: No results for input(s): GLUCAP in the last  168 hours. Lipid Profile: No results for input(s): CHOL, HDL, LDLCALC, TRIG, CHOLHDL, LDLDIRECT in the last 72 hours. Thyroid Function Tests: No results for input(s): TSH, T4TOTAL, FREET4, T3FREE, THYROIDAB in the last 72 hours. Anemia Panel: No results for input(s): VITAMINB12, FOLATE, FERRITIN, TIBC, IRON, RETICCTPCT in the last 72 hours. Sepsis Labs: No results for input(s): PROCALCITON, LATICACIDVEN in the last 168 hours.  No results found for this or any previous visit (from the past 240 hour(s)).    Radiology Studies: No results found.  Scheduled Meds: . amoxicillin-clavulanate  1 tablet Oral Q12H  .  bisacodyl  5 mg Oral Daily  . docusate sodium  100 mg Oral BID  . polyethylene glycol  17 g Oral Daily   Continuous Infusions:    LOS: 11 days    Time spent: > 35 minutes    Penny Pia, MD [facility] Pager [phone]  If 7PM-7AM, please contact night-coverage [email or web address] Password [facility] 05/02/2016, 2:39 PM

## 2016-05-03 NOTE — Progress Notes (Signed)
PROGRESS NOTE    ANH MANGANO  RUE:[id number] DOB: 11/08/1991 DOA: 04/21/2016 PCP: No PCP Per Patient   Brief Narrative:  Tony Walsh is an 25 y.o. male the PMH of IV drug abuse admitted for treatment of a right forearm/antecubital fossa abscess with a retained foreign body s/p I&D and removal of foreign body (part of a needle) 04/21/16. Staying til weekend for appropriate wound care.   Assessment & Plan:   Principal Problem:   Abscess of right forearm - Ortho managing - continue supportive therapy. Patient reports his pain is currently controlled. - continue augmentin will need 3 more weeks - Most likely d/c next am.  Active Problems:   IV drug abuse - recommend cessation    Chronic hepatitis C (HCC) -  stable    Cocaine abuse with cocaine-induced mood disorder (HCC) - Stable we'll have to monitor this times patient seems aggressive   DVT prophylaxis: Early ambulation Code Status: Full Family Communication: Discussed directly with patient Disposition Plan: once ready for d/c by ortho   Consultants:   Orthopedic surgery   Procedures: Please refer to ortho's notes   Antimicrobials: Augmentin   Subjective: Pt has no new complaints reported to me today  Objective: Filed Vitals:   05/02/16 0641 05/02/16 1511 05/02/16 2300 05/03/16 0713  BP: 120/60 148/83 141/72 120/77  Pulse: 66 99 80 78  Temp: 97.6 F (36.4 C) 97.9 F (36.6 C) 97.9 F (36.6 C) 99 F (37.2 C)  TempSrc: Oral Oral  Oral  Resp: Height:      Weight:      SpO2: 100% 97% 100% 100%    Intake/Output Summary (Last 24 hours) at 05/03/16 1441 Last data filed at 05/02/16 2300  Gross per 24 hour  Intake    640 ml  Output      0 ml  Net    640 ml   Filed Weights   04/21/16 1600  Weight: 108.863 kg (240 lb)    Examination:  General exam: Awake and alert Respiratory system: no increased wob. Respiratory effort normal. Cardiovascular system: no cyanosis, or  edema Gastrointestinal system: Abdomen is nondistended, no guarding Central nervous system: Alert and oriented. No focal neurological deficits. Extremities: Symmetric 5 x 5 power. Skin: R forearm bandage in place Psychiatry: Judgement and insight appear normal. Mood & affect appropriate.     Data Reviewed: I have personally reviewed following labs and imaging studies  CBC:  Recent Labs Lab 04/29/16 0653  WBC 5.4  NEUTROABS 3.2  HGB 12.8*  HCT 40.0  MCV 85.5  PLT 211   Basic Metabolic Panel:  Recent Labs Lab 04/29/16 0653  NA 137  K 4.3  CL 106  CO2 25  GLUCOSE 103*  BUN 13  CREATININE 1.01  CALCIUM 9.2   GFR: Estimated Creatinine Clearance: 149.9 mL/min (by C-G formula based on Cr of 1.01). Liver Function Tests:  Recent Labs Lab 04/29/16 0653  AST 19  ALT 24  ALKPHOS 36*  BILITOT 0.4  PROT 7.0  ALBUMIN 3.5   No results for input(s): LIPASE, AMYLASE in the last 168 hours. No results for input(s): AMMONIA in the last 168 hours. Coagulation Profile: No results for input(s): INR, PROTIME in the last 168 hours. Cardiac Enzymes: No results for input(s): CKTOTAL, CKMB, CKMBINDEX, TROPONINI in the last 168 hours. BNP (last 3 results) No results for input(s): PROBNP in the last 8760 hours. HbA1C: No results for input(s): HGBA1C in the last  72 hours. CBG: No results for input(s): GLUCAP in the last 168 hours. Lipid Profile: No results for input(s): CHOL, HDL, LDLCALC, TRIG, CHOLHDL, LDLDIRECT in the last 72 hours. Thyroid Function Tests: No results for input(s): TSH, T4TOTAL, FREET4, T3FREE, THYROIDAB in the last 72 hours. Anemia Panel: No results for input(s): VITAMINB12, FOLATE, FERRITIN, TIBC, IRON, RETICCTPCT in the last 72 hours. Sepsis Labs: No results for input(s): PROCALCITON, LATICACIDVEN in the last 168 hours.  No results found for this or any previous visit (from the past 240 hour(s)).    Radiology Studies: No results found.  Scheduled  Meds: . amoxicillin-clavulanate  1 tablet Oral Q12H  . bisacodyl  5 mg Oral Daily  . docusate sodium  100 mg Oral BID  . polyethylene glycol  17 g Oral Daily   Continuous Infusions:    LOS: 12 days    Time spent: > 35 minutes    Penny PiaVEGA, Mykenzie Ebanks, MD [facility] Pager [phone]  If 7PM-7AM, please contact night-coverage [email or web address] Password [email or web address] 05/03/2016, 2:41 PM

## 2016-05-03 NOTE — Progress Notes (Signed)
Physical Therapy Wound Treatment Patient Details  Name: Tony Walsh MRN: [id number] Date of Birth: 09-29-1991  Today's Date: 05/03/2016 Time: 4166-0630 Time Calculation (min): 35 min  Subjective  Subjective: Pt requested he receive pain medicine after we were done with hydrotherapy. Patient and Family Stated Goals: heal wounds and save my arm Date of Onset:  (PTA) Prior Treatments: I&D   Pain Score: Pt asked for all pain medication after hydrotherapy instead of before and then after. No significant pain noted with treatment.  Wound Assessment  Wound / Incision (Open or Dehisced) 04/22/16 Incision - Open Arm Right Forearm (Active)  Dressing Type Compression wrap;Gauze (Comment);Moist to dry;ABD 05/03/2016  9:00 AM  Dressing Changed Changed 05/03/2016  9:00 AM  Dressing Status Clean;Dry;Intact 05/03/2016  9:00 AM  Dressing Change Frequency Daily 05/03/2016  9:00 AM  Site / Wound Assessment Red;Yellow 05/03/2016  9:00 AM  % Wound base Red or Granulating 95% 05/03/2016  9:00 AM  % Wound base Yellow 5% 05/03/2016  9:00 AM  % Wound base Black 0% 05/03/2016  9:00 AM  % Wound base Other (Comment) 0% 05/03/2016  9:00 AM  Peri-wound Assessment Intact;Edema 05/03/2016  9:00 AM  Wound Length (cm) 9.5 cm 04/29/2016  1:03 PM  Wound Width (cm) 1.3 cm 04/29/2016  1:03 PM  Wound Depth (cm) 1.3 cm 04/29/2016  1:03 PM  Tunneling (cm) .2 at 7 o'clock 04/22/2016  2:00 PM  Undermining (cm) 2.9 cm at 3 o'clock most proximally, 2.8 cm at 2-3 o'clock, 2.6 cm at 1 o'clock. 04/30/2016  1:12 PM  Margins Unattached edges (unapproximated) 05/03/2016  9:00 AM  Closure Sutures 05/03/2016  9:00 AM  Drainage Amount Minimal 05/03/2016  9:00 AM  Drainage Description Serosanguineous;No odor 05/03/2016  9:00 AM  Non-staged Wound Description Full thickness 05/01/2016  8:39 AM  Treatment Hydrotherapy (Pulse lavage);Packing (Saline gauze) 05/03/2016  9:00 AM   Hydrotherapy Pulsed lavage therapy - wound location: Rt forearm Pulsed  Lavage with Suction (psi): 8 psi (4 at times due to pain) Pulsed Lavage with Suction - Normal Saline Used: 1000 mL Pulsed Lavage Tip: Tip with splash shield   Wound Assessment and Plan  Wound Therapy - Assess/Plan/Recommendations Wound Therapy - Clinical Statement: Pt tolerated well.  Wound Therapy - Functional Problem List: Decreased strength due to pain.  Factors Delaying/Impairing Wound Healing: Substance abuse Hydrotherapy Plan: Dressing change;Debridement;Patient/family education;Pulsatile lavage with suction Wound Therapy - Frequency: 6X / week Wound Therapy - Follow Up Recommendations: Home health RN Wound Plan: See above  Wound Therapy Goals- Improve the function of patient's integumentary system by progressing the wound(s) through the phases of wound healing (inflammation - proliferation - remodeling) by: Patient/Family will be able to : manage self-dressing changes Patient/Family Instruction Goal - Progress: Progressing toward goal Additional Wound Therapy Goal: Demonstrate proper positioning and UE exercises to reduce edema Additional Wound Therapy Goal - Progress: Progressing toward goal Goals/treatment plan/discharge plan were made with and agreed upon by patient/family: Yes Time For Goal Achievement: 7 days Wound Therapy - Potential for Goals: Excellent  Goals will be updated until maximal potential achieved or discharge criteria met.  Discharge criteria: when goals achieved, discharge from hospital, MD decision/surgical intervention, no progress towards goals, refusal/missing three consecutive treatments without notification or medical reason.  GP     Rolinda Roan 05/03/2016, 9:26 AM   Rolinda Roan, PT, DPT Acute Rehabilitation Services Pager: [phone]

## 2016-05-03 NOTE — Progress Notes (Signed)
Patient ID: Tony Walsh, male   DOB: 11-Feb-1991, 25 y.o.   MRN: [id number] Patient is alert and oriented.  I have specifically went over all plans as well as medicines with patient. At bedside I had him perform a wet-to-dry dressing change to his own arm. He gloved his hands and performed the dressing change himself without difficulty. He will need to perform this twice a day upon leaving the hospital. I would recommend that he be discharged tomorrow.  After he performed his wet-to-dry dressing change without any pain he requested more IV pain medicine and I discussed with him that I would continue a by mouth schedule but that I did not feel that IV dye allotted would be necessary based upon his level of pain  This is been at constant issue unfortunately.  I've been able to wean him off of the does allotted PCA and off of the MS Contin as prescribed by the hospitalist service. I have also weaned him from IV dilaudid except for his dressing change with whirlpool.  I would allow continued by mouth dye allotted. I would recommend a prescription for the dilaudid on DC as well as 3 weeks of Augmentin  I discussed with patient all issues.  He once again became angry that I declined the IV pain medicine after a simple wet-to-dry dressing change. I reminded him that tomorrow will be 2 weeks in the hospital. I'll mind him that most patients affectively R DC'd much sooner than this due to the fact that they're capable of performing more of their own wound care and life capabilities. I've also discussed with him my continued plan which is been outlined to him at length which is to wean him off of the IV pain medicines which she cannot take home.  Unfortunately I do feel there is some cognitive deficits. I discussed and these issues at length.  We will plan to DC patient tomorrow with home wet-to-dry dressing changes. We will address his wound tomorrow and then align him to continue the wound care at home  understanding that we'll be 2-3 weeks before it closes completely  I have specifically discussed with patient he can shower with the arm as it is and then perform his dressing changes with wet-to-dry's. I've specifically discussed with the patient that I would recommend twice a day dressing changes. He understands this.  He unfortunately is more than challenging.  I discussed him all issues and questions. I specifically presented to the hospital each day this week and Memorial Day weekend when I'm not on call to make sure our plans were not sabotage. I did this in an effort to make sure that the wean goes smoothly and that we effectively treat this patient pain and his wound as perfectly as possible. In my opinion I've done everything to bend over backwards for him. I it has been completely frustrating to myself and the nursing staff.  I do feel that we have a plan in place for DC tomorrow  Oneil Behney M.D.

## 2016-05-04 MED ORDER — HYDROMORPHONE HCL 2 MG PO TABS: 2.0000 mg | ORAL_TABLET | Freq: Four times a day (QID) | ORAL | Status: DC | PRN

## 2016-05-04 MED ORDER — AMOXICILLIN-POT CLAVULANATE 875-125 MG PO TABS: 1.0000 | ORAL_TABLET | Freq: Two times a day (BID) | ORAL | Status: DC

## 2016-05-04 NOTE — Progress Notes (Signed)
Tony Walsh to be D/C'd Home per MD order.  Discussed with the patient and all questions fully answered.  VSS, Skin clean, dry and intact without evidence of skin break down, no evidence of skin tears noted. IV catheter discontinued intact. Site without signs and symptoms of complications. Dressing and pressure applied.  An After Visit Summary was printed and given to the patient. Patient received prescription.  D/c education completed with patient including follow up instructions, medication list, d/c activities limitations if indicated, with other d/c instructions as indicated by MD - patient able to verbalize understanding, all questions fully answered.   Patient instructed to return to ED, call 911, or call MD for any changes in condition.   Patient will be escorted via WC, and D/C home via private auto.  Tony Walsh 05/04/2016 10:45 AM

## 2016-05-04 NOTE — Discharge Summary (Signed)
Physician Discharge Summary  Tony Walsh:[id number] DOB: 24-Dec-1990 DOA: 04/21/2016  PCP: No PCP Per Patient  Admit date: 04/21/2016 Discharge date: 05/04/2016  Time spent: > 35 minutes  Recommendations for Outpatient Follow-up:  1. Recommend f/u with orthopaedic surgeon   Discharge Diagnoses:  Principal Problem:   Abscess of right forearm Active Problems:   IV drug abuse   Chronic hepatitis C (HCC)   Cocaine abuse with cocaine-induced mood disorder (HCC)   Discharge Condition: stable  Diet recommendation: regular diet  Filed Weights   04/21/16 1600  Weight: 108.863 kg (240 lb)    History of present illness:  25 y.o. male with medical history significant of IV drug abuse presents to the ER because of increasing pain in the right forearm around the right antecubital fossa. Patient had used IV drugs last being an over the last 2 days has increasing swelling pain and fever and chills. Patient was referred to the ER from the urgent care center. X-rays showed right forearm foreign body. Dr. Amanda Pea, hand surgeon was consulted and patient was taken to the OR and at this time patient is status post incision and drainage of the abscess.  Hospital Course:   Principal Problem:  Abscess of right forearm - Ortho managing while patient in house has recommended the following: I had a very long conversation with him about all issues.  I would recommend Augmentin 875 twice a day 3 weeks and.allotted 2 mg 1-2 tablets every 6 hours as needed for pain dispensed #50  He is clear for discharge at this juncture  I would recommend referral to primary care physician.  Given his hepatitis C issues and other challenges I feel that he will need care outside of a surgical specialty at this juncture for health maintenance etc.  Active Problems:  IV drug abuse - recommended cessation   Chronic hepatitis C (HCC) - stable   Cocaine abuse with cocaine-induced mood disorder  (HCC) - Stable we'll have to monitor this times patient seems aggressive  Procedures:  Please refer to ortho notes  Consultations:  Orthopaedic surgery  Discharge Exam: Filed Vitals:   05/03/16 2014 05/04/16 0545  BP: 137/79 132/68  Pulse: 92 88  Temp: 97.4 F (36.3 C) 97.8 F (36.6 C)  Resp: 20 18    General: Pt in nad, alert and awake Cardiovascular: rrr, no rubs Respiratory: no increased wob  Discharge Instructions   Discharge Instructions    Call MD for:  extreme fatigue    Complete by:  As directed      Call MD for:  redness, tenderness, or signs of infection (pain, swelling, redness, odor or green/yellow discharge around incision site)    Complete by:  As directed      Call MD for:  redness, tenderness, or signs of infection (pain, swelling, redness, odor or green/yellow discharge around incision site)    Complete by:  As directed      Call MD for:  severe uncontrolled pain    Complete by:  As directed      Call MD for:  temperature >100.4    Complete by:  As directed      Call MD for:  temperature >100.4    Complete by:  As directed      Diet - low sodium heart healthy    Complete by:  As directed      Diet - low sodium heart healthy    Complete by:  As directed  Discharge instructions    Complete by:  As directed   Please follow up with your orthopaedic surgeon within the next 1 week.     Discharge instructions    Complete by:  As directed   Please follow up with your primary care physician (call and make appointment if you do not have one) and you orthopaedic surgeon.     Driving Restrictions    Complete by:  As directed   May not drive or operate heavy machinery while on Dilaudid     Increase activity slowly    Complete by:  As directed      Increase activity slowly    Complete by:  As directed      Other Restrictions    Complete by:  As directed   Please read warning on medication labels and take as directed.          Current Discharge  Medication List    START taking these medications   Details  amoxicillin-clavulanate (AUGMENTIN) 875-125 MG tablet Take 1 tablet by mouth every 12 (twelve) hours. Qty: 42 tablet, Refills: 0    HYDROmorphone (DILAUDID) 2 MG tablet Take 1-2 tablets (2-4 mg total) by mouth every 6 (six) hours as needed for severe pain. Qty: 50 tablet, Refills: 0       No Known Allergies    The results of significant diagnostics from this hospitalization (including imaging, microbiology, ancillary and laboratory) are listed below for reference.    Significant Diagnostic Studies: Dg Forearm Right  04/20/2016  CLINICAL DATA:  The patient thinks he broke a needle in right arm. EXAM: RIGHT FOREARM - 2 VIEW COMPARISON:  None. FINDINGS: There is a 1.3 cm radiopaque needle in the lateral soft tissues of the right forearm with associated soft tissue swelling. The bones are normal. IMPRESSION: There is a 1.3 cm radiopaque needle in the lateral soft tissues of the right forearm with associated soft tissue swelling. Electronically Signed   By: Sherian ReinWei-Chen  Lin M.D.   On: 04/20/2016 21:26    Microbiology: No results found for this or any previous visit (from the past 240 hour(s)).   Labs: Basic Metabolic Panel:  Recent Labs Lab 04/29/16 0653  NA 137  K 4.3  CL 106  CO2 25  GLUCOSE 103*  BUN 13  CREATININE 1.01  CALCIUM 9.2   Liver Function Tests:  Recent Labs Lab 04/29/16 0653  AST 19  ALT 24  ALKPHOS 36*  BILITOT 0.4  PROT 7.0  ALBUMIN 3.5   No results for input(s): LIPASE, AMYLASE in the last 168 hours. No results for input(s): AMMONIA in the last 168 hours. CBC:  Recent Labs Lab 04/29/16 0653  WBC 5.4  NEUTROABS 3.2  HGB 12.8*  HCT 40.0  MCV 85.5  PLT 211   Cardiac Enzymes: No results for input(s): CKTOTAL, CKMB, CKMBINDEX, TROPONINI in the last 168 hours. BNP: BNP (last 3 results) No results for input(s): BNP in the last 8760 hours.  ProBNP (last 3 results) No results for  input(s): PROBNP in the last 8760 hours.  CBG: No results for input(s): GLUCAP in the last 168 hours.   Signed:  Penny PiaVEGA, Lucia Harm MD.  [facility] 05/04/2016, 10:16 AM

## 2016-05-04 NOTE — Progress Notes (Signed)
Patient ID: Tony Walsh, male   DOB: 1991/08/30, 25 y.o.   MRN: [id number] Patient seen at bedside.  Bowel signs stable afebrile.  Wound once again was changed by myself with wet-to-dry dressing changes. The patient understands the process and the plans. He will perform twice a day dressing changes.  I removed his sutures as it is now been 2 weeks since his initial operative intervention.  I expect over the next 2-3 weeks this will all closing nicely.  He remains neurovascularly intact without signs of infection.  With good wound care this should heal up in 2 weeks. Should he have problems I'll be immediately available. I'll plan to see him back as needed.  He understands all issues in detail.  I had a very long conversation with him about all issues.  I would recommend Augmentin 875 twice a day 3 weeks and.allotted 2 mg 1-2 tablets every 6 hours as needed for pain dispensed #50  He is clear for discharge at this juncture  I would recommend referral to primary care physician.  Given his hepatitis C issues and other challenges I feel that he will need care outside of a surgical specialty at this juncture for health maintenance etc.  Nuel Dejaynes M.D.

## 2016-05-04 NOTE — Care Management (Signed)
Case manager arranged for  appointment to establish a primary MD for patient at the [facility]. Appointment is scheduled for Wednesday, June 21,2017 at 9:30am with Julianne HandlerLaChina Hollis, NP.

## 2016-05-07 ENCOUNTER — Encounter (HOSPITAL_COMMUNITY): Payer: Self-pay | Admitting: *Deleted

## 2016-05-07 ENCOUNTER — Emergency Department (HOSPITAL_COMMUNITY)
Admission: EM | Admit: 2016-05-07 | Discharge: 2016-05-07 | Disposition: A | Discharge status: Home / Self Care | Payer: Medicaid Other | Attending: Emergency Medicine | Admitting: Emergency Medicine

## 2016-05-07 DIAGNOSIS — Z4801 Encounter for change or removal of surgical wound dressing: Secondary | ICD-10-CM | POA: Insufficient documentation

## 2016-05-07 DIAGNOSIS — Z87828 Personal history of other (healed) physical injury and trauma: Secondary | ICD-10-CM | POA: Diagnosis not present

## 2016-05-07 DIAGNOSIS — Z8619 Personal history of other infectious and parasitic diseases: Secondary | ICD-10-CM | POA: Diagnosis not present

## 2016-05-07 DIAGNOSIS — M791 Myalgia: Secondary | ICD-10-CM | POA: Insufficient documentation

## 2016-05-07 DIAGNOSIS — Z8659 Personal history of other mental and behavioral disorders: Secondary | ICD-10-CM | POA: Diagnosis not present

## 2016-05-07 DIAGNOSIS — Z5189 Encounter for other specified aftercare: Secondary | ICD-10-CM

## 2016-05-07 NOTE — Discharge Instructions (Signed)
Continue with wet to dry dressing daily and follow up with your hand specialist as needed for further care.  Wound Care Taking care of your wound properly can help to prevent pain and infection. It can also help your wound to heal more quickly.  HOW TO CARE FOR YOUR WOUND  Take or apply over-the-counter and prescription medicines only as told by your health care provider.  If you were prescribed antibiotic medicine, take or apply it as told by your health care provider. Do not stop using the antibiotic even if your condition improves.  Clean the wound each day or as told by your health care provider.  Wash the wound with mild soap and water.  Rinse the wound with water to remove all soap.  Pat the wound dry with a clean towel. Do not rub it.  There are many different ways to close and cover a wound. For example, a wound can be covered with stitches (sutures), skin glue, or adhesive strips. Follow instructions from your health care provider about:  How to take care of your wound.  When and how you should change your bandage (dressing).  When you should remove your dressing.  Removing whatever was used to close your wound.  Check your wound every day for signs of infection. Watch for:  Redness, swelling, or pain.  Fluid, blood, or pus.  Keep the dressing dry until your health care provider says it can be removed. Do not take baths, swim, use a hot tub, or do anything that would put your wound underwater until your health care provider approves.  Raise (elevate) the injured area above the level of your heart while you are sitting or lying down.  Do not scratch or pick at the wound.  Keep all follow-up visits as told by your health care provider. This is important. SEEK MEDICAL CARE IF:  You received a tetanus shot and you have swelling, severe pain, redness, or bleeding at the injection site.  You have a fever.  Your pain is not controlled with medicine.  You have  increased redness, swelling, or pain at the site of your wound.  You have fluid, blood, or pus coming from your wound.  You notice a bad smell coming from your wound or your dressing. SEEK IMMEDIATE MEDICAL CARE IF:  You have a red streak going away from your wound.   This information is not intended to replace advice given to you by your health care provider. Make sure you discuss any questions you have with your health care provider.   Document Released: 08/31/2008 Document Revised: 04/08/2015 Document Reviewed: 11/18/2014 Elsevier Interactive Patient Education [employer]2016 Elsevier Inc.

## 2016-05-07 NOTE — ED Notes (Signed)
PT states surgery to R arm 2.4 weeks ago to R FA.  He was told to call the surgeon if there was a color change to wound.  No obvious s/s of infection.  Pt also states he ran out of pain meds today.
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
## 2016-05-26 ENCOUNTER — Ambulatory Visit: Payer: Self-pay | Admitting: Family Medicine

## 2016-06-09 ENCOUNTER — Emergency Department (HOSPITAL_COMMUNITY)
Admission: EM | Admit: 2016-06-09 | Discharge: 2016-06-10 | Payer: Medicaid Other | Attending: Emergency Medicine | Admitting: Emergency Medicine

## 2016-06-09 ENCOUNTER — Ambulatory Visit: Payer: Self-pay | Admitting: Family Medicine

## 2016-06-09 ENCOUNTER — Encounter (HOSPITAL_COMMUNITY): Payer: Self-pay | Admitting: Emergency Medicine

## 2016-06-09 DIAGNOSIS — F1414 Cocaine abuse with cocaine-induced mood disorder: Secondary | ICD-10-CM | POA: Diagnosis not present

## 2016-06-09 DIAGNOSIS — F1110 Opioid abuse, uncomplicated: Secondary | ICD-10-CM | POA: Diagnosis not present

## 2016-06-09 DIAGNOSIS — F329 Major depressive disorder, single episode, unspecified: Secondary | ICD-10-CM | POA: Insufficient documentation

## 2016-06-09 DIAGNOSIS — F419 Anxiety disorder, unspecified: Secondary | ICD-10-CM | POA: Diagnosis present

## 2016-06-09 DIAGNOSIS — F1290 Cannabis use, unspecified, uncomplicated: Secondary | ICD-10-CM | POA: Insufficient documentation

## 2016-06-09 LAB — COMPREHENSIVE METABOLIC PANEL
ALBUMIN: 4.2 g/dL (ref 3.5–5.0)
ALT: 17 U/L (ref 17–63)
ANION GAP: 6 (ref 5–15)
AST: 17 U/L (ref 15–41)
Alkaline Phosphatase: 49 U/L (ref 38–126)
BILIRUBIN TOTAL: 0.3 mg/dL (ref 0.3–1.2)
BUN: 17 mg/dL (ref 6–20)
CO2: 26 mmol/L (ref 22–32)
Calcium: 9 mg/dL (ref 8.9–10.3)
Chloride: 105 mmol/L (ref 101–111)
Creatinine, Ser: 1.11 mg/dL (ref 0.61–1.24)
GLUCOSE: 101 mg/dL — AB (ref 65–99)
POTASSIUM: 4.2 mmol/L (ref 3.5–5.1)
Sodium: 137 mmol/L (ref 135–145)
TOTAL PROTEIN: 8 g/dL (ref 6.5–8.1)

## 2016-06-09 LAB — CBC
HCT: 41.6 % (ref 39.0–52.0)
Hemoglobin: 14.4 g/dL (ref 13.0–17.0)
MCH: 28.3 pg (ref 26.0–34.0)
MCHC: 34.6 g/dL (ref 30.0–36.0)
MCV: 81.9 fL (ref 78.0–100.0)
PLATELETS: 193 10*3/uL (ref 150–400)
RBC: 5.08 MIL/uL (ref 4.22–5.81)
RDW: 14 % (ref 11.5–15.5)
WBC: 4.3 10*3/uL (ref 4.0–10.5)

## 2016-06-09 LAB — SALICYLATE LEVEL

## 2016-06-09 LAB — ACETAMINOPHEN LEVEL

## 2016-06-09 LAB — ETHANOL

## 2016-06-09 MED ORDER — ACETAMINOPHEN 325 MG PO TABS: 650.0000 mg | ORAL_TABLET | Freq: Four times a day (QID) | ORAL | Status: DC | PRN

## 2016-06-09 MED ORDER — IBUPROFEN 200 MG PO TABS: 600.0000 mg | ORAL_TABLET | Freq: Four times a day (QID) | ORAL | Status: DC | PRN

## 2016-06-09 NOTE — ED Notes (Signed)
Patient denies SI, HI, AVH. Reports feelings of depression. Appears anxious. Rapid speech. Reports poor sleep and racing thoughts. States he was given Rx for depression medications but never started taking the medicine "I felt better and didn't think I needed meds. I started working and put all my energy into work". Patient states he has children and a son on the way and does not want to hurt himself. Feels he needs rest.  Encouragement offered. Oriented to the unit. Snack provided.  Q 15 safety checks in place.

## 2016-06-09 NOTE — ED Notes (Signed)
Sitter at bedside.

## 2016-06-09 NOTE — ED Notes (Signed)
Pt wanded by security, changed into paper scrubs and belongings were placed in one bag and placed at the nursing station.
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
## 2016-06-09 NOTE — ED Notes (Signed)
Pt [facility]. Per paperwork he has been having SI and has been 'doing stuff that should kill him but i don't die.' Alert and oriented. [facility] per [facility] because he has an 'open wound' from a surgery 6 weeks ago on his arm.
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
## 2016-06-10 ENCOUNTER — Observation Stay (HOSPITAL_COMMUNITY): Admission: AD | Admit: 2016-06-10 | Payer: Medicaid Other | Source: Intra-hospital | Admitting: Psychiatry

## 2016-06-10 DIAGNOSIS — F1414 Cocaine abuse with cocaine-induced mood disorder: Secondary | ICD-10-CM | POA: Diagnosis not present

## 2016-06-10 DIAGNOSIS — F332 Major depressive disorder, recurrent severe without psychotic features: Secondary | ICD-10-CM | POA: Insufficient documentation

## 2016-06-10 MED ORDER — BACITRACIN ZINC 500 UNIT/GM EX OINT
1.0000 "application " | TOPICAL_OINTMENT | Freq: Three times a day (TID) | CUTANEOUS | Status: DC | PRN
  Administered 2016-06-10: 10:00:00 via TOPICAL
  Filled 2016-06-10: qty 28.35

## 2016-06-10 MED ORDER — HYDROXYZINE HCL 25 MG PO TABS: 25.0000 mg | ORAL_TABLET | Freq: Three times a day (TID) | ORAL | Status: DC | PRN

## 2016-06-10 MED ORDER — TRAZODONE HCL 100 MG PO TABS: 100.0000 mg | ORAL_TABLET | Freq: Every day | ORAL | Status: DC

## 2016-06-10 MED ORDER — CITALOPRAM HYDROBROMIDE 10 MG PO TABS
10.0000 mg | ORAL_TABLET | Freq: Every day | ORAL | Status: DC
  Administered 2016-06-10: 10 mg via ORAL
  Filled 2016-06-10: qty 1

## 2016-06-10 NOTE — Discharge Instructions (Signed)
For your ongoing mental health needs, you are advised to follow up with [facility].  New and returning patients are seen at their walk-in clinic.  Walk-in hours are Monday - Friday from 8:00 am - 3:00 pm.  Walk-in patients are seen on a first come, first served basis.  Try to arrive as early as possible for he best chance of being seen the same day: ° °     [facility] °     [address] °     [place], [place] [address] °     [phone] ° °To help you maintain a sober lifestyle, a substance abuse treatment program may be beneficial to you.  Contact [facility] at your earliest opportunity to ask about enrolling in their program: ° °     [facility] ([facility] °     [address] °     [place], [place] [address] °     [phone] °     New patients are seen at the walk-in clinic every Tuesday from 9:00 am - 12:00 pm. °

## 2016-06-10 NOTE — BH Assessment (Addendum)
[facility] Assessment Progress Note  Per Thedore MinsMojeed Akintayo, MD, this pt would benefit from admission to the [facility] Observ[facility] Unit at this time. Pt presents under IVC, which Dr Jannifer FranklinAkintayo has rescinded. [name], RN reports that no Observation Unit beds are available at this time, but that she expects availability later today. She advises me to have pt sign admission documents. Pt has signed Voluntary Admission and Consent for Treatment, as well as Consent to [facility] Information [facility], and signed [facility]. Pt's nurse, Diane, has been notified, and agrees to send original paperwork along with pt via [facility] [place], and to call report to [phone] or [phone] when the time comes.  Tony Canninghomas Rohnan Bartleson, MA Triage Specialist [phone]    Addendum:  Per Inetta Fermoina, pt has been assigned to Obs 1.  Diane has been notified.  Tony Canninghomas Maudean Hoffmann, MA Triage Specialist [phone]   Addendum:  Pt's nurse, Diane, reports that pt has begun to exhibit behavior that is inappropriate for the Observation Unit.  This has been staffed with Nanine MeansJamison Lord, DNP, and she has determined that pt [facility] referral information for mental health and substance abuse treatment.  Discharge instructions advise [facility] and [facility].  Both Inetta Fermoina and Sedalia MutaDiane has been notified.  Tony Canninghomas Samik Balkcom, MA Triage Specialist [phone]
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
## 2016-06-10 NOTE — ED Notes (Signed)
Pt in bed watching TV. Calm and cooperative. Bandages appeared slight soiled. Changed dressing.

## 2016-06-10 NOTE — ED Notes (Signed)
Pt has been uncooperative. He would not get off the telephone when asked and would not pull his pants up over his underwear. He said, "Fuck this place, I'll go fucking spastic. When staff asked for his vital signs he refused and said, "I know my blood pressure is high and it is because you are not giving me food."

## 2016-06-11 NOTE — Consult Note (Signed)
Patient stayed on the phone most of the afternoon and refused to get off.  He was inquiring about phone use in the observation unit and was more focused on talking to people on the phone than treatment.  Due to noncompliance with the rules, he was discharged from the ED.  No suicidal/homicidal ideations, hallucinations, or withdrawal symptoms.  Nanine MeansJamison Lord, PMH-NP Patient seen face-to-face for psychiatric evaluation, chart reviewed and case discussed with the physician extender and developed treatment plan. Reviewed the information documented and agree with the treatment plan. Thedore MinsMojeed Misaki Sozio, MD

## 2016-06-11 NOTE — [facility]HH Suicide Risk Assessment (Addendum)
Suicide Risk Assessment  Discharge Assessment   [facility]   Principal Problem: Cocaine abuse with cocaine-induced mood disorder [facility]) Discharge Diagnoses:  Patient Active Problem List   Diagnosis Date Noted  . Cocaine abuse with cocaine-induced mood disorder [facility]CC) [F14.14] 04/22/2016    Priority: High  . Recurrent major depression-severe ([facility]) [F33.2] 06/10/2016  . Chronic hepatitis C ([facility]) [B18.2] 04/22/2016  . Abscess of right forearm [L02.413] 04/21/2016  . IV drug abuse [F19.10] 04/21/2016    Total Time spent with patient: 45 minutes  Musculoskeletal: Strength & Muscle Tone: within normal limits Gait & Station: normal Patient leans: N/A  Psychiatric Specialty Exam: Physical Exam  Constitutional: He is oriented to person, place, and time. He appears well-developed and well-nourished.  HENT:  Head: Normocephalic.  Neck: Normal range of motion.  Respiratory: Effort normal.  Musculoskeletal: Normal range of motion.  Neurological: He is alert and oriented to person, place, and time.  Skin: Skin is warm and dry.  Psychiatric: His speech is normal and behavior is normal. Judgment and thought content normal. Cognition and memory are normal. He exhibits a depressed mood.    Review of Systems  Constitutional: Negative.  HENT: Negative.  Eyes: Negative.  Respiratory: Negative.  Cardiovascular: Negative.  Gastrointestinal: Negative.  Genitourinary: Negative.  Musculoskeletal: Negative.  Skin: Negative.  Neurological: Negative.  Endo/Heme/Allergies: Negative.  Psychiatric/Behavioral: Positive for depression and substance abuse.    Blood pressure 103/59, pulse 61, temperature 98.1 F (36.7 C), temperature source Oral, resp. rate 16, SpO2 98 %.There is no weight on file to calculate BMI.  General Appearance: Casual  Eye Contact: Good  Speech: Normal Rate  Volume: Normal  Mood: Depressed  Affect: Congruent  Thought  Process: Coherent and Descriptions of Associations: Intact  Orientation: Full (Time, Place, and Person)  Thought Content: WDL  Suicidal Thoughts: No  Homicidal Thoughts: No  Memory: Immediate; Fair Recent; Fair Remote; Fair  Judgement: Fair  Insight: Fair  Psychomotor Activity: Normal  Concentration: Concentration: Fair and Attention Span: Fair  Recall: [employer]  Fund of Knowledge: Fair  Language: Good  Akathisia: No  Handed: Right  AIMS (if indicated):    Assets: Leisure Time Physical Health Resilience Social Support  ADL's: Intact  Cognition: WNL  Sleep:          Mental Status Per Nursing Assessment::   On Admission:   cocaine abuse with suicidal ideations  Demographic Factors:  Male  Loss Factors: NA  Historical Factors: NA  Risk Reduction Factors:   Sense of responsibility to family, Living with another person, especially a relative and Positive social support  Continued Clinical Symptoms:  Depression, mild  Cognitive Features That Contribute To Risk:  None    Suicide Risk:  Minimal: No identifiable suicidal ideation.  Patients presenting with no risk factors but with morbid ruminations; may be classified as minimal risk based on the severity of the depressive symptoms    Plan Of Care/Follow-up recommendations:  Activity:  as tolerated Diet:  heart heatlhy diet  Nhia Heaphy, NP 06/11/2016, 6:00 PM

## 2016-07-13 ENCOUNTER — Emergency Department (HOSPITAL_COMMUNITY): Payer: Medicaid Other

## 2016-07-13 ENCOUNTER — Emergency Department (HOSPITAL_COMMUNITY)
Admission: EM | Admit: 2016-07-13 | Discharge: 2016-07-13 | Disposition: A | Discharge status: Home / Self Care | Payer: Medicaid Other | Source: Home / Self Care | Attending: Emergency Medicine | Admitting: Emergency Medicine

## 2016-07-13 ENCOUNTER — Encounter (HOSPITAL_COMMUNITY): Payer: Self-pay | Admitting: Emergency Medicine

## 2016-07-13 ENCOUNTER — Emergency Department (HOSPITAL_COMMUNITY)
Admission: EM | Admit: 2016-07-13 | Discharge: 2016-07-13 | Disposition: A | Discharge status: Home / Self Care | Payer: Medicaid Other | Attending: Emergency Medicine | Admitting: Emergency Medicine

## 2016-07-13 DIAGNOSIS — S060X1A Concussion with loss of consciousness of 30 minutes or less, initial encounter: Secondary | ICD-10-CM | POA: Diagnosis not present

## 2016-07-13 DIAGNOSIS — W19XXXA Unspecified fall, initial encounter: Secondary | ICD-10-CM

## 2016-07-13 DIAGNOSIS — W132XXA Fall from, out of or through roof, initial encounter: Secondary | ICD-10-CM | POA: Diagnosis not present

## 2016-07-13 DIAGNOSIS — S0093XA Contusion of unspecified part of head, initial encounter: Secondary | ICD-10-CM | POA: Insufficient documentation

## 2016-07-13 DIAGNOSIS — W1789XA Other fall from one level to another, initial encounter: Secondary | ICD-10-CM

## 2016-07-13 DIAGNOSIS — S41111A Laceration without foreign body of right upper arm, initial encounter: Secondary | ICD-10-CM

## 2016-07-13 DIAGNOSIS — Y939 Activity, unspecified: Secondary | ICD-10-CM | POA: Insufficient documentation

## 2016-07-13 DIAGNOSIS — F329 Major depressive disorder, single episode, unspecified: Secondary | ICD-10-CM | POA: Insufficient documentation

## 2016-07-13 DIAGNOSIS — Y990 Civilian activity done for income or pay: Secondary | ICD-10-CM

## 2016-07-13 DIAGNOSIS — Y999 Unspecified external cause status: Secondary | ICD-10-CM | POA: Insufficient documentation

## 2016-07-13 DIAGNOSIS — Y9259 Other trade areas as the place of occurrence of the external cause: Secondary | ICD-10-CM

## 2016-07-13 DIAGNOSIS — S8001XA Contusion of right knee, initial encounter: Secondary | ICD-10-CM | POA: Insufficient documentation

## 2016-07-13 DIAGNOSIS — Y929 Unspecified place or not applicable: Secondary | ICD-10-CM | POA: Insufficient documentation

## 2016-07-13 DIAGNOSIS — S0990XA Unspecified injury of head, initial encounter: Secondary | ICD-10-CM | POA: Diagnosis present

## 2016-07-13 MED ORDER — CEPHALEXIN 500 MG PO CAPS: 500.0000 mg | ORAL_CAPSULE | Freq: Four times a day (QID) | ORAL | 0 refills | Status: DC

## 2016-07-13 MED ORDER — CEPHALEXIN 250 MG PO CAPS
500.0000 mg | ORAL_CAPSULE | Freq: Once | ORAL | Status: AC
  Administered 2016-07-13: 500 mg via ORAL
  Filled 2016-07-13: qty 2

## 2016-07-13 MED ORDER — CEPHALEXIN 500 MG PO CAPS: 500.0000 mg | ORAL_CAPSULE | Freq: Two times a day (BID) | ORAL | 0 refills | Status: DC

## 2016-07-13 MED ORDER — HYDROMORPHONE HCL 1 MG/ML IJ SOLN
0.5000 mg | Freq: Once | INTRAMUSCULAR | Status: AC
  Administered 2016-07-13: 0.5 mg via INTRAVENOUS
  Filled 2016-07-13: qty 1

## 2016-07-13 MED ORDER — CEPHALEXIN 250 MG PO CAPS: 250.0000 mg | ORAL_CAPSULE | Freq: Once | ORAL | Status: DC

## 2016-07-13 MED ORDER — TRAMADOL HCL 50 MG PO TABS
50.0000 mg | ORAL_TABLET | Freq: Once | ORAL | Status: AC
  Administered 2016-07-13: 50 mg via ORAL
  Filled 2016-07-13: qty 1

## 2016-07-13 MED ORDER — OXYCODONE-ACETAMINOPHEN 10-325 MG PO TABS: 1.0000 | ORAL_TABLET | Freq: Three times a day (TID) | ORAL | 0 refills | Status: DC | PRN

## 2016-07-13 MED ORDER — LORAZEPAM 2 MG/ML IJ SOLN
1.0000 mg | Freq: Once | INTRAMUSCULAR | Status: AC
  Administered 2016-07-13: 1 mg via INTRAVENOUS
  Filled 2016-07-13: qty 1

## 2016-07-13 NOTE — ED Notes (Signed)
MD at bedside. 

## 2016-07-13 NOTE — ED Notes (Signed)
Pt requesting arm sling to relax R arm where it was cut.

## 2016-07-13 NOTE — ED Notes (Signed)
Patient transported to X-ray 
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
## 2016-07-13 NOTE — ED Triage Notes (Signed)
Pt here after falling approx 10 feet landing on some furniture , pt states that he pulled a piece of wood out his right bicep , pt also c/o right knee pain head and back pain

## 2016-07-13 NOTE — ED Notes (Signed)
Returned from xray

## 2016-07-13 NOTE — ED Notes (Signed)
Pt able to ambulate in hall , no complaints of pain

## 2016-07-13 NOTE — ED Notes (Signed)
Patient transported to CT 

## 2016-07-13 NOTE — ED Notes (Signed)
Patient was alert, oriented and stable upon discharge. RN went over AVS and patient had no further questions.  

## 2016-07-13 NOTE — ED Notes (Signed)
Wound flushed , bacitracin  applied and wrapped with kerlex , pt tolerated well

## 2016-07-14 ENCOUNTER — Encounter (HOSPITAL_COMMUNITY): Payer: Self-pay | Admitting: *Deleted

## 2016-07-14 ENCOUNTER — Emergency Department (HOSPITAL_COMMUNITY): Payer: Medicaid Other

## 2016-07-14 ENCOUNTER — Emergency Department (HOSPITAL_COMMUNITY)
Admission: EM | Admit: 2016-07-14 | Discharge: 2016-07-15 | Disposition: A | Discharge status: Home / Self Care | Payer: Medicaid Other | Attending: Emergency Medicine | Admitting: Emergency Medicine

## 2016-07-14 DIAGNOSIS — M25551 Pain in right hip: Secondary | ICD-10-CM

## 2016-07-14 DIAGNOSIS — Y939 Activity, unspecified: Secondary | ICD-10-CM | POA: Diagnosis not present

## 2016-07-14 DIAGNOSIS — W19XXXD Unspecified fall, subsequent encounter: Secondary | ICD-10-CM

## 2016-07-14 DIAGNOSIS — W1789XD Other fall from one level to another, subsequent encounter: Secondary | ICD-10-CM | POA: Diagnosis not present

## 2016-07-14 DIAGNOSIS — Y929 Unspecified place or not applicable: Secondary | ICD-10-CM | POA: Insufficient documentation

## 2016-07-14 DIAGNOSIS — Y999 Unspecified external cause status: Secondary | ICD-10-CM | POA: Diagnosis not present

## 2016-07-14 LAB — BASIC METABOLIC PANEL
ANION GAP: 9 (ref 5–15)
BUN: 11 mg/dL (ref 6–20)
CHLORIDE: 106 mmol/L (ref 101–111)
CO2: 21 mmol/L — AB (ref 22–32)
CREATININE: 1.04 mg/dL (ref 0.61–1.24)
Calcium: 9.1 mg/dL (ref 8.9–10.3)
GFR calc non Af Amer: >60 mL/min (ref >60)
Glucose, Bld: 96 mg/dL (ref 65–99)
POTASSIUM: 4 mmol/L (ref 3.5–5.1)
SODIUM: 136 mmol/L (ref 135–145)

## 2016-07-14 LAB — CBC
HCT: 42 % (ref 39.0–52.0)
HEMOGLOBIN: 14.1 g/dL (ref 13.0–17.0)
MCH: 28 pg (ref 26.0–34.0)
MCHC: 33.6 g/dL (ref 30.0–36.0)
MCV: 83.5 fL (ref 78.0–100.0)
PLATELETS: 149 10*3/uL — AB (ref 150–400)
RBC: 5.03 MIL/uL (ref 4.22–5.81)
RDW: 14.6 % (ref 11.5–15.5)
WBC: 5.5 10*3/uL (ref 4.0–10.5)

## 2016-07-14 LAB — CBG MONITORING, ED: Glucose-Capillary: 126 mg/dL — ABNORMAL HIGH (ref 65–99)

## 2016-07-14 MED ORDER — SODIUM CHLORIDE 0.9 % IV BOLUS (SEPSIS)
1000.0000 mL | Freq: Once | INTRAVENOUS | Status: AC
  Administered 2016-07-15: 1000 mL via INTRAVENOUS

## 2016-07-14 MED ORDER — KETOROLAC TROMETHAMINE 30 MG/ML IJ SOLN
30.0000 mg | Freq: Once | INTRAMUSCULAR | Status: AC
  Administered 2016-07-15: 30 mg via INTRAVENOUS
  Filled 2016-07-14: qty 1

## 2016-07-14 MED ORDER — METOCLOPRAMIDE HCL 5 MG/ML IJ SOLN
10.0000 mg | Freq: Once | INTRAMUSCULAR | Status: AC
  Administered 2016-07-15: 10 mg via INTRAVENOUS
  Filled 2016-07-14: qty 2

## 2016-07-14 NOTE — ED Notes (Signed)
CBG is 126. 

## 2016-07-14 NOTE — ED Notes (Signed)
Pt called in waiting area for re-check of vital signs with no response.

## 2016-07-14 NOTE — ED Triage Notes (Signed)
Pt was seen at this ED and [facility] yesterday for fall.

## 2016-07-14 NOTE — ED Triage Notes (Signed)
Pt states he was here yesterday and fell 10 feet. States he has been home and has been having dizziness since. Pt states he does not want pain medication. Pt upset regarding pain medications that were given yesterday. States, " I wanna be checked out for real, treat me like a real human being."
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
## 2016-07-15 ENCOUNTER — Other Ambulatory Visit: Payer: Self-pay | Admitting: Occupational Medicine

## 2016-07-15 ENCOUNTER — Ambulatory Visit (HOSPITAL_COMMUNITY)
Admission: EM | Admit: 2016-07-15 | Discharge: 2016-07-15 | Disposition: A | Discharge status: Other Acute Inpatient Hospital | Payer: Medicaid Other

## 2016-07-15 ENCOUNTER — Ambulatory Visit: Payer: Self-pay

## 2016-07-15 DIAGNOSIS — M545 Low back pain: Secondary | ICD-10-CM

## 2016-07-15 NOTE — Discharge Instructions (Signed)
Your x-ray does not show fracture of hip. It is likely bruised from your fall.   Take ibuprofen and tylenol for pain. Ice or use heat therapy. Drink plenty of fluids and get rest.  Return for worsening symptoms, including inability to walk, confusion, intractable vomiting, escalating pain or any other symptoms concerning to you.

## 2016-07-15 NOTE — ED Notes (Signed)
Pt requesting pain medication besides what the RN had already administered. Pt informed that he would not receive any narcotic. Pt requesting them for his arm. Pt shortly after yelling for the RN to return to the room while in another pt room. Pt demanding that staff remove the equipment from him and take the IV out. PT also stated that he needed to use the bathroom and that if [facility] that he was going to "piss on the floor." MD informed. Pt then requested that another RN remove the IV so that he could go.

## 2016-07-16 ENCOUNTER — Emergency Department (HOSPITAL_COMMUNITY): Payer: Medicaid Other

## 2016-07-16 ENCOUNTER — Emergency Department (HOSPITAL_COMMUNITY)
Admission: EM | Admit: 2016-07-16 | Discharge: 2016-07-16 | Disposition: A | Discharge status: Home / Self Care | Payer: Medicaid Other | Attending: Emergency Medicine | Admitting: Emergency Medicine

## 2016-07-16 ENCOUNTER — Encounter (HOSPITAL_COMMUNITY): Payer: Self-pay

## 2016-07-16 DIAGNOSIS — Y929 Unspecified place or not applicable: Secondary | ICD-10-CM | POA: Diagnosis not present

## 2016-07-16 DIAGNOSIS — Y939 Activity, unspecified: Secondary | ICD-10-CM | POA: Insufficient documentation

## 2016-07-16 DIAGNOSIS — S0990XD Unspecified injury of head, subsequent encounter: Secondary | ICD-10-CM | POA: Diagnosis present

## 2016-07-16 DIAGNOSIS — W1789XD Other fall from one level to another, subsequent encounter: Secondary | ICD-10-CM | POA: Insufficient documentation

## 2016-07-16 DIAGNOSIS — M5431 Sciatica, right side: Secondary | ICD-10-CM | POA: Insufficient documentation

## 2016-07-16 DIAGNOSIS — S060X1D Concussion with loss of consciousness of 30 minutes or less, subsequent encounter: Secondary | ICD-10-CM | POA: Diagnosis not present

## 2016-07-16 DIAGNOSIS — Y999 Unspecified external cause status: Secondary | ICD-10-CM | POA: Diagnosis not present

## 2016-07-16 MED ORDER — OXYCODONE HCL 5 MG PO TABS: 2.5000 mg | ORAL_TABLET | ORAL | 0 refills | Status: DC | PRN

## 2016-07-16 MED ORDER — MECLIZINE HCL 25 MG PO TABS: 25.0000 mg | ORAL_TABLET | Freq: Three times a day (TID) | ORAL | 0 refills | Status: DC | PRN

## 2016-07-16 MED ORDER — NAPROXEN 500 MG PO TABS: 500.0000 mg | ORAL_TABLET | Freq: Two times a day (BID) | ORAL | 0 refills | Status: DC

## 2016-07-16 MED ORDER — MECLIZINE HCL 25 MG PO TABS
25.0000 mg | ORAL_TABLET | Freq: Once | ORAL | Status: AC
  Administered 2016-07-16: 25 mg via ORAL
  Filled 2016-07-16: qty 1

## 2016-07-16 MED ORDER — ONDANSETRON 4 MG PO TBDP
4.0000 mg | ORAL_TABLET | Freq: Once | ORAL | Status: AC
  Administered 2016-07-16: 4 mg via ORAL
  Filled 2016-07-16: qty 1

## 2016-07-16 MED ORDER — OXYCODONE-ACETAMINOPHEN 5-325 MG PO TABS
2.0000 | ORAL_TABLET | Freq: Once | ORAL | Status: AC
  Administered 2016-07-16: 2 via ORAL
  Filled 2016-07-16: qty 2
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
## 2016-07-16 NOTE — ED Notes (Signed)
This rn went over discharge and follow up instructions as well as discharge prescriptions, pt verbalized understanding.

## 2016-07-16 NOTE — ED Notes (Signed)
Arthor CaptainAbigail Harris, PA at bedside to discuss discharge instructions and ct scan results with patient. Pt verbalized understanding.
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
## 2016-07-16 NOTE — ED Notes (Signed)
PA Harris at bedside  

## 2016-07-19 ENCOUNTER — Ambulatory Visit (HOSPITAL_COMMUNITY)
Admission: EM | Admit: 2016-07-19 | Discharge: 2016-07-19 | Discharge status: Against Medical Advice | Payer: Medicaid Other

## 2016-07-19 ENCOUNTER — Encounter (HOSPITAL_COMMUNITY): Payer: Self-pay | Admitting: Emergency Medicine

## 2016-07-19 DIAGNOSIS — M5441 Lumbago with sciatica, right side: Secondary | ICD-10-CM | POA: Diagnosis not present
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
## 2016-07-19 NOTE — ED Notes (Signed)
[facility]

## 2016-07-19 NOTE — ED Notes (Signed)
PT refuses transfer to the ED and elects to go to the workman's comp building. PT does not let staff know he is refusing until he is in the waiting room. PT verbalizes that he understands that the MDs recommendation is to report to the ED. PT confirms that he understands and he does not wish to go the ED.

## 2016-07-19 NOTE — ED Triage Notes (Signed)
PT fell from an 18 ft height. PT has seen the ED and PCP several times in the last week. PT reports he has had several scans. PT has lingering dizziness and severe back pain and leg pain.

## 2016-07-23 ENCOUNTER — Emergency Department (HOSPITAL_COMMUNITY)
Admission: EM | Admit: 2016-07-23 | Discharge: 2016-07-23 | Disposition: A | Discharge status: Home / Self Care | Payer: Medicaid Other | Attending: Emergency Medicine | Admitting: Emergency Medicine

## 2016-07-23 ENCOUNTER — Encounter (HOSPITAL_COMMUNITY): Payer: Self-pay | Admitting: Emergency Medicine

## 2016-07-23 DIAGNOSIS — M545 Low back pain, unspecified: Secondary | ICD-10-CM

## 2016-07-23 DIAGNOSIS — R519 Headache, unspecified: Secondary | ICD-10-CM

## 2016-07-23 DIAGNOSIS — R51 Headache: Secondary | ICD-10-CM | POA: Diagnosis not present

## 2016-07-23 DIAGNOSIS — M549 Dorsalgia, unspecified: Secondary | ICD-10-CM | POA: Diagnosis present

## 2016-07-23 MED ORDER — IBUPROFEN 800 MG PO TABS: 800.0000 mg | ORAL_TABLET | Freq: Three times a day (TID) | ORAL | 0 refills | Status: DC

## 2016-07-23 MED ORDER — KETOROLAC TROMETHAMINE 60 MG/2ML IM SOLN
60.0000 mg | Freq: Once | INTRAMUSCULAR | Status: AC
  Administered 2016-07-23: 60 mg via INTRAMUSCULAR
  Filled 2016-07-23: qty 2

## 2016-07-23 NOTE — ED Triage Notes (Signed)
Pt. reports chronic low back pain for 2 weeks from a fall , ambulatory , denies urinary discomfort , pt. added headache today , denies fever or chills .
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
## 2016-08-05 ENCOUNTER — Emergency Department (HOSPITAL_COMMUNITY)
Admission: EM | Admit: 2016-08-05 | Discharge: 2016-08-05 | Disposition: A | Discharge status: Home / Self Care | Payer: Medicaid Other | Attending: Dermatology | Admitting: Dermatology

## 2016-08-05 ENCOUNTER — Encounter (HOSPITAL_COMMUNITY): Payer: Self-pay

## 2016-08-05 DIAGNOSIS — Z5321 Procedure and treatment not carried out due to patient leaving prior to being seen by health care provider: Secondary | ICD-10-CM | POA: Diagnosis not present

## 2016-08-05 DIAGNOSIS — Y999 Unspecified external cause status: Secondary | ICD-10-CM | POA: Insufficient documentation

## 2016-08-05 DIAGNOSIS — Y939 Activity, unspecified: Secondary | ICD-10-CM | POA: Insufficient documentation

## 2016-08-05 DIAGNOSIS — M545 Low back pain: Secondary | ICD-10-CM | POA: Insufficient documentation

## 2016-08-05 DIAGNOSIS — W1830XA Fall on same level, unspecified, initial encounter: Secondary | ICD-10-CM | POA: Insufficient documentation

## 2016-08-05 DIAGNOSIS — Y929 Unspecified place or not applicable: Secondary | ICD-10-CM | POA: Insufficient documentation

## 2016-08-05 NOTE — ED Notes (Signed)
Pt was called in ER waiting room twice. There was no response. Will try again.

## 2016-08-05 NOTE — ED Notes (Signed)
Pt called twice, but did not respond. Will try again.

## 2016-08-05 NOTE — ED Triage Notes (Addendum)
Patient here with chronic pain related to fall several weeks ago. Has had imaging and several evaluations for same. States ongoing lower back pain and intermittent headaches. To see ortho next week. No new trauma. Original injury from fall. States his head hurts worse today.

## 2016-08-30 ENCOUNTER — Ambulatory Visit: Payer: Self-pay | Admitting: Family Medicine

## 2016-11-25 ENCOUNTER — Emergency Department (HOSPITAL_COMMUNITY): Payer: Medicaid Other

## 2016-11-25 ENCOUNTER — Encounter (HOSPITAL_COMMUNITY): Payer: Self-pay | Admitting: *Deleted

## 2016-11-25 ENCOUNTER — Emergency Department (HOSPITAL_COMMUNITY)
Admission: EM | Admit: 2016-11-25 | Discharge: 2016-11-26 | Disposition: A | Discharge status: Home / Self Care | Payer: Medicaid Other | Attending: Emergency Medicine | Admitting: Emergency Medicine

## 2016-11-25 DIAGNOSIS — Z8249 Family history of ischemic heart disease and other diseases of the circulatory system: Secondary | ICD-10-CM | POA: Diagnosis not present

## 2016-11-25 DIAGNOSIS — F1414 Cocaine abuse with cocaine-induced mood disorder: Secondary | ICD-10-CM | POA: Diagnosis present

## 2016-11-25 DIAGNOSIS — R45851 Suicidal ideations: Secondary | ICD-10-CM | POA: Diagnosis not present

## 2016-11-25 DIAGNOSIS — F1410 Cocaine abuse, uncomplicated: Secondary | ICD-10-CM

## 2016-11-25 DIAGNOSIS — F329 Major depressive disorder, single episode, unspecified: Secondary | ICD-10-CM | POA: Diagnosis present

## 2016-11-25 DIAGNOSIS — R079 Chest pain, unspecified: Secondary | ICD-10-CM | POA: Diagnosis not present

## 2016-11-25 DIAGNOSIS — Z9889 Other specified postprocedural states: Secondary | ICD-10-CM | POA: Diagnosis not present

## 2016-11-25 DIAGNOSIS — Z79899 Other long term (current) drug therapy: Secondary | ICD-10-CM | POA: Insufficient documentation

## 2016-11-25 LAB — COMPREHENSIVE METABOLIC PANEL
ALBUMIN: 4.2 g/dL (ref 3.5–5.0)
ALK PHOS: 40 U/L (ref 38–126)
ALT: 23 U/L (ref 17–63)
ANION GAP: 8 (ref 5–15)
AST: 21 U/L (ref 15–41)
BILIRUBIN TOTAL: 0.9 mg/dL (ref 0.3–1.2)
BUN: 15 mg/dL (ref 6–20)
CALCIUM: 9.1 mg/dL (ref 8.9–10.3)
CO2: 27 mmol/L (ref 22–32)
Chloride: 101 mmol/L (ref 101–111)
Creatinine, Ser: 1.29 mg/dL — ABNORMAL HIGH (ref 0.61–1.24)
GFR calc Af Amer: >60 mL/min (ref >60)
GLUCOSE: 94 mg/dL (ref 65–99)
POTASSIUM: 3.8 mmol/L (ref 3.5–5.1)
Sodium: 136 mmol/L (ref 135–145)
TOTAL PROTEIN: 7.7 g/dL (ref 6.5–8.1)

## 2016-11-25 LAB — RAPID URINE DRUG SCREEN, HOSP PERFORMED
AMPHETAMINES: NOT DETECTED
Barbiturates: NOT DETECTED
Benzodiazepines: NOT DETECTED
COCAINE: POSITIVE — AB
OPIATES: NOT DETECTED
TETRAHYDROCANNABINOL: POSITIVE — AB

## 2016-11-25 LAB — ETHANOL: Alcohol, Ethyl (B): <5 mg/dL (ref <5)

## 2016-11-25 LAB — CBC
HEMATOCRIT: 42.4 % (ref 39.0–52.0)
HEMOGLOBIN: 14.6 g/dL (ref 13.0–17.0)
MCH: 28.2 pg (ref 26.0–34.0)
MCHC: 34.4 g/dL (ref 30.0–36.0)
MCV: 81.9 fL (ref 78.0–100.0)
Platelets: 200 10*3/uL (ref 150–400)
RBC: 5.18 MIL/uL (ref 4.22–5.81)
RDW: 14.1 % (ref 11.5–15.5)
WBC: 6.8 10*3/uL (ref 4.0–10.5)

## 2016-11-25 LAB — I-STAT TROPONIN, ED: Troponin i, poc: 0 ng/mL (ref 0.00–0.08)

## 2016-11-25 LAB — ACETAMINOPHEN LEVEL

## 2016-11-25 LAB — SALICYLATE LEVEL: Salicylate Lvl: <7 mg/dL (ref 2.8–30.0)

## 2016-11-25 MED ORDER — GABAPENTIN 100 MG PO CAPS
200.0000 mg | ORAL_CAPSULE | Freq: Two times a day (BID) | ORAL | Status: DC
  Administered 2016-11-25 (×2): 200 mg via ORAL
  Filled 2016-11-25 (×2): qty 2

## 2016-11-25 MED ORDER — ALUM & MAG HYDROXIDE-SIMETH 200-200-20 MG/5ML PO SUSP: 30.0000 mL | ORAL | Status: DC | PRN

## 2016-11-25 MED ORDER — IBUPROFEN 200 MG PO TABS
600.0000 mg | ORAL_TABLET | Freq: Three times a day (TID) | ORAL | Status: DC | PRN
  Administered 2016-11-25: 600 mg via ORAL
  Filled 2016-11-25: qty 3

## 2016-11-25 MED ORDER — TRAZODONE HCL 100 MG PO TABS: 100.0000 mg | ORAL_TABLET | Freq: Every day | ORAL | Status: DC

## 2016-11-25 MED ORDER — ONDANSETRON HCL 4 MG PO TABS: 4.0000 mg | ORAL_TABLET | Freq: Three times a day (TID) | ORAL | Status: DC | PRN

## 2016-11-25 MED ORDER — TRAZODONE HCL 100 MG PO TABS
100.0000 mg | ORAL_TABLET | Freq: Every evening | ORAL | Status: DC | PRN
  Administered 2016-11-25: 100 mg via ORAL
  Filled 2016-11-25: qty 1

## 2016-11-25 MED ORDER — ACETAMINOPHEN 325 MG PO TABS: 650.0000 mg | ORAL_TABLET | ORAL | Status: DC | PRN

## 2016-11-25 MED ORDER — ZOLPIDEM TARTRATE 5 MG PO TABS: 5.0000 mg | ORAL_TABLET | Freq: Every evening | ORAL | Status: DC | PRN

## 2016-11-25 MED ORDER — NICOTINE 21 MG/24HR TD PT24
21.0000 mg | MEDICATED_PATCH | Freq: Every day | TRANSDERMAL | Status: DC
Start: 2016-11-25 — End: 2016-11-26
  Administered 2016-11-25: 21 mg via TRANSDERMAL
  Filled 2016-11-25: qty 1

## 2016-11-25 NOTE — ED Notes (Signed)
Patient seen in his room awake watching TV. Patient stated "I need help. They want to discharge me this morning but I wasn't ready. You see, I have nice warm house and beautiful kids but I can't go home to them like this. I need help, I don't want to kill myself or harm anybody". Staff encouraged patient call and follow up with the resources provided to him. Snacks and drinks provided. Will continue to monitor patient.
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
## 2016-11-25 NOTE — ED Notes (Addendum)
Pt states he is "unhappy that he is being discharged." States that he expressed that "drugs will make him end up in a body bag if he doesn't stop." He adds that he needs help to stop doing drugs. After I invited my Consulting civil engineerCharge RN and TTS counselor to come help explain both his evaluation and the discharge instructions, pt further stated that "drugs are making him want to kill himself." He denied having plan that he would use to "kill himself."
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
## 2016-11-25 NOTE — ED Notes (Addendum)
Pt called [place] [facility] facility seeking to "be admitted right away. Told them if he cannot get in today, he will hurt himself." A staff member from that facility requested to talk to one of the staff members here to relay what the patient had expressed. I have communicated this to my charge RN and psychiatry team further requesting discharge hold and a reevaluation albeit his suicidallity was evaluated per care hand off report.

## 2016-11-25 NOTE — ED Notes (Signed)
Pt reports he called [employer] today due to having suicidal thoughts, he sts he is under a lot of stress, was diagnosed with depression in the past and is not taking medications. He sts he uses cocaine (last used few hours ago), and after using this morning he developed sharp, cramping pain in his left chest. He sts after the chest pain subside "for just a minute I used again and I knew I could die but I didn't care. That's what made me come in. This time I really need help".

## 2016-11-25 NOTE — ED Notes (Signed)
Pt sts he would like to be tested for HIV, wants to start medications for hep C, and also would like to have his "body tested for cancers". Explained to pt that he is here with a different complaint and that he will have to follow up with his PCP. Pt voiced understanding.

## 2016-11-25 NOTE — ED Notes (Signed)
Pt has 1 belonging bag in locker #27

## 2016-11-26 DIAGNOSIS — Z8249 Family history of ischemic heart disease and other diseases of the circulatory system: Secondary | ICD-10-CM | POA: Diagnosis not present

## 2016-11-26 DIAGNOSIS — Z9889 Other specified postprocedural states: Secondary | ICD-10-CM | POA: Diagnosis not present

## 2016-11-26 DIAGNOSIS — Z79899 Other long term (current) drug therapy: Secondary | ICD-10-CM | POA: Diagnosis not present

## 2016-11-26 DIAGNOSIS — F1414 Cocaine abuse with cocaine-induced mood disorder: Secondary | ICD-10-CM

## 2016-11-26 NOTE — [facility] Suicide Risk Assessment (Signed)
Suicide Risk Assessment  Discharge Assessment   [facility]   Principal Problem: Cocaine abuse with cocaine-induced mood disorder [facility](HCC) Discharge Diagnoses:  Patient Active Problem List   Diagnosis Date Noted  . Cocaine abuse with cocaine-induced mood disorder [facility](HCC) [F14.14] 04/22/2016    Priority: High  . Recurrent major depression-severe (HCC) [F33.2] 06/10/2016  . Chronic hepatitis C (HCC) [B18.2] 04/22/2016  . Abscess of right forearm [L02.413] 04/21/2016  . IV drug abuse [F19.10] 04/21/2016    Total Time spent with patient: 45 minutes  Musculoskeletal: Strength & Muscle Tone: within normal limits Gait & Station: normal Patient leans: N/A  Psychiatric Specialty Exam: Physical Exam  Constitutional: He is oriented to person, place, and time. He appears well-developed and well-nourished.  HENT:  Head: Normocephalic.  Neck: Normal range of motion.  Respiratory: Effort normal.  Musculoskeletal: Normal range of motion.  Neurological: He is alert and oriented to person, place, and time.  Psychiatric: His speech is normal. Judgment and thought content normal. His affect is angry. He is aggressive. Cognition and memory are normal.    Review of Systems  Psychiatric/Behavioral: Positive for substance abuse.  All other systems reviewed and are negative.   Blood pressure 122/61, pulse 69, temperature 98.6 F (37 C), temperature source Oral, resp. rate 18, SpO2 99 %.There is no height or weight on file to calculate BMI.  General Appearance: Casual  Eye Contact:  Good  Speech:  Normal Rate  Volume:  Normal  Mood:  Angry and Irritable  Affect:  Congruent  Thought Process:  Coherent and Descriptions of Associations: Intact  Orientation:  Full (Time, Place, and Person)  Thought Content:  WDL  Suicidal Thoughts:  No  Homicidal Thoughts:  No  Memory:  Immediate;   Good Recent;   Good Remote;   Good  Judgement:  Fair  Insight:  Fair  Psychomotor  Activity:  Normal  Concentration:  Concentration: Good and Attention Span: Good  Recall:  Good  Fund of Knowledge:  Fair  Language:  Good  Akathisia:  No  Handed:  Right  AIMS (if indicated):     Assets:  Leisure Time Physical Health Resilience Social Support  ADL's:  Intact  Cognition:  WNL  Sleep:      Mental Status Per Nursing Assessment::   On Admission:   cocaine abuse  Demographic Factors:  Male and Adolescent or young adult  Loss Factors: NA  Historical Factors: NA  Risk Reduction Factors:   Sense of responsibility to family, Living with another person, especially a relative and Positive social support  Continued Clinical Symptoms:  Irritable and angry at times  Cognitive Features That Contribute To Risk:  None    Suicide Risk:  Minimal: No identifiable suicidal ideation.  Patients presenting with no risk factors but with morbid ruminations; may be classified as minimal risk based on the severity of the depressive symptoms  Follow-up Information    Please refer to list of providers given Follow up.   Why:  You have been given an extensive list of substance abuse resources for inpatient and outpatient as well as a list of area psychiatrists and counselors. Please contact these resources to get help dealing with your depression and substance use.          Plan Of Care/Follow-up recommendations:  Activity:  as tolerated Diet:  heart healthy diet  LORD, JAMISON, NP 11/26/2016, 11:06 AM
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
## 2016-11-26 NOTE — ED Notes (Signed)
Pt making demands on the unit. Demanded that Dr. Jannifer FranklinAkintayo give him extra food. Demanded that staff fill out a form for him. Had difficulty understanding the process here. When Dr. Jannifer FranklinAkintayo told him that he was to be discharged he became verbally aggressive with threatening physically posturing. It took about 8 security and police to get him discharged off the unit. He refused VS and teaching. His belongings were returned. He insisted that he has a cell phone which was not found. Staff on [place] never saw the cell phone. Per report his person was searched for the cell phone with the thought that he was keeping it in his room, yesterday. About 15 minutes after leaving he came back and requested his discharge instuctions which were given to him. He wanted them so that his wife will allow him back home.

## 2016-11-26 NOTE — ED Notes (Signed)
Patient resting will let day shift know

## 2018-10-07 ENCOUNTER — Other Ambulatory Visit: Payer: Self-pay

## 2018-10-07 ENCOUNTER — Encounter (HOSPITAL_COMMUNITY): Payer: Self-pay

## 2018-10-07 ENCOUNTER — Emergency Department (HOSPITAL_COMMUNITY)
Admission: EM | Admit: 2018-10-07 | Discharge: 2018-10-07 | Disposition: A | Discharge status: Home / Self Care | Payer: Medicaid Other | Attending: Emergency Medicine | Admitting: Emergency Medicine

## 2018-10-07 DIAGNOSIS — F329 Major depressive disorder, single episode, unspecified: Secondary | ICD-10-CM | POA: Diagnosis present

## 2018-10-07 DIAGNOSIS — Z79899 Other long term (current) drug therapy: Secondary | ICD-10-CM | POA: Diagnosis not present

## 2018-10-07 DIAGNOSIS — R45851 Suicidal ideations: Secondary | ICD-10-CM | POA: Diagnosis not present

## 2018-10-07 DIAGNOSIS — F332 Major depressive disorder, recurrent severe without psychotic features: Secondary | ICD-10-CM | POA: Diagnosis not present

## 2018-10-07 DIAGNOSIS — F1410 Cocaine abuse, uncomplicated: Secondary | ICD-10-CM | POA: Insufficient documentation

## 2018-10-07 HISTORY — DX: Other psychoactive substance abuse, uncomplicated: F19.10

## 2018-10-07 HISTORY — DX: Mental disorder, not otherwise specified: F99

## 2018-10-07 LAB — RAPID URINE DRUG SCREEN, HOSP PERFORMED
AMPHETAMINES: NOT DETECTED
Barbiturates: NOT DETECTED
Benzodiazepines: POSITIVE — AB
Cocaine: POSITIVE — AB
Opiates: NOT DETECTED
Tetrahydrocannabinol: POSITIVE — AB

## 2018-10-07 LAB — CBC
HCT: 41.3 % (ref 39.0–52.0)
HEMOGLOBIN: 13.4 g/dL (ref 13.0–17.0)
MCH: 27.6 pg (ref 26.0–34.0)
MCHC: 32.4 g/dL (ref 30.0–36.0)
MCV: 85 fL (ref 80.0–100.0)
PLATELETS: 158 10*3/uL (ref 150–400)
RBC: 4.86 MIL/uL (ref 4.22–5.81)
RDW: 13.8 % (ref 11.5–15.5)
WBC: 4.5 10*3/uL (ref 4.0–10.5)
nRBC: 0 % (ref 0.0–0.2)

## 2018-10-07 LAB — COMPREHENSIVE METABOLIC PANEL
ALK PHOS: 44 U/L (ref 38–126)
ALT: 18 U/L (ref 0–44)
AST: 23 U/L (ref 15–41)
Albumin: 3.8 g/dL (ref 3.5–5.0)
Anion gap: 9 (ref 5–15)
BUN: 12 mg/dL (ref 6–20)
CO2: 26 mmol/L (ref 22–32)
Calcium: 8.6 mg/dL — ABNORMAL LOW (ref 8.9–10.3)
Chloride: 105 mmol/L (ref 98–111)
Creatinine, Ser: 1.16 mg/dL (ref 0.61–1.24)
GFR calc non Af Amer: >60 mL/min (ref >60)
Glucose, Bld: 88 mg/dL (ref 70–99)
Potassium: 3.8 mmol/L (ref 3.5–5.1)
SODIUM: 140 mmol/L (ref 135–145)
Total Bilirubin: 0.8 mg/dL (ref 0.3–1.2)
Total Protein: 7.6 g/dL (ref 6.5–8.1)

## 2018-10-07 LAB — ETHANOL: Alcohol, Ethyl (B): <10 mg/dL (ref <10)

## 2018-10-07 LAB — ACETAMINOPHEN LEVEL: Acetaminophen (Tylenol), Serum: <10 ug/mL — ABNORMAL LOW (ref 10–30)

## 2018-10-07 LAB — SALICYLATE LEVEL

## 2018-10-07 MED ORDER — ACETAMINOPHEN 325 MG PO TABS: 650.0000 mg | ORAL_TABLET | ORAL | Status: DC | PRN

## 2018-10-07 MED ORDER — ASPIRIN 81 MG PO CHEW: 324.0000 mg | CHEWABLE_TABLET | Freq: Once | ORAL | Status: DC

## 2018-10-07 NOTE — BH Assessment (Signed)
[facility] Assessment Progress Note Case was staffed with charge nurse Alex Gardener RN, Cresenciano Genre and Shyrl Numbers MD who agreed to rescind IVC and patient was discharged after being medically cleared to [employer]'s custody.

## 2018-10-07 NOTE — ED Notes (Addendum)
This Clinical research associate is documenting the patients personal items. The patient has a pack of extra hum, 2 yellow round earrings, 2 cell phones, half of a cigarette, 1 purple lighter, 1 green dot visa card, 1 silver key , 1 bar of soap, 1 white syringe wrapper, and 1 white deposit slip.  [employer] has taken one cell phone from the patients possessions because the patient is not the owner of the cellular device.

## 2018-10-07 NOTE — ED Notes (Signed)
SECURITY CLEARED PATIENT 

## 2018-10-07 NOTE — Discharge Instructions (Signed)
Tony Walsh was seen in the ER for suicidal thoughts.  Behavioral health team has cleared him from psychiatry perspective.  Tony Walsh was noted to have used IV cocaine in the ER.  He does not have any chest pain in the emergency room, however if he starts having severe chest pains or shortness of breath please bring him back to the emergency room for further evaluation.  The psychiatry team is cleared him from mental health perspective, however they are deferring "suicide precautions" to the prison.
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
## 2018-10-07 NOTE — ED Notes (Signed)
Tony Walsh [phone], [phone] Mother can be contacted for any information that staff may need
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
## 2018-10-07 NOTE — ED Notes (Signed)
Pt discharged safely with [employer].   Pt was in no distress.
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
## 2018-10-07 NOTE — ED Notes (Signed)
TRANSFERRED WITHOUT EVENT. PT IS IVC
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
## 2018-10-13 ENCOUNTER — Other Ambulatory Visit: Payer: Self-pay

## 2018-10-13 ENCOUNTER — Emergency Department (HOSPITAL_BASED_OUTPATIENT_CLINIC_OR_DEPARTMENT_OTHER)
Admission: EM | Admit: 2018-10-13 | Discharge: 2018-10-13 | Disposition: A | Discharge status: Home / Self Care | Payer: Medicaid Other | Attending: Emergency Medicine | Admitting: Emergency Medicine

## 2018-10-13 ENCOUNTER — Encounter (HOSPITAL_BASED_OUTPATIENT_CLINIC_OR_DEPARTMENT_OTHER): Payer: Self-pay | Admitting: *Deleted

## 2018-10-13 DIAGNOSIS — Z202 Contact with and (suspected) exposure to infections with a predominantly sexual mode of transmission: Secondary | ICD-10-CM

## 2018-10-13 DIAGNOSIS — R369 Urethral discharge, unspecified: Secondary | ICD-10-CM | POA: Insufficient documentation

## 2018-10-13 DIAGNOSIS — Z113 Encounter for screening for infections with a predominantly sexual mode of transmission: Secondary | ICD-10-CM | POA: Diagnosis not present

## 2018-10-13 DIAGNOSIS — R300 Dysuria: Secondary | ICD-10-CM | POA: Diagnosis not present

## 2018-10-13 LAB — URINALYSIS, MICROSCOPIC (REFLEX)

## 2018-10-13 LAB — URINALYSIS, ROUTINE W REFLEX MICROSCOPIC
BILIRUBIN URINE: NEGATIVE
Glucose, UA: NEGATIVE mg/dL
HGB URINE DIPSTICK: NEGATIVE
Ketones, ur: NEGATIVE mg/dL
NITRITE: NEGATIVE
PROTEIN: 100 mg/dL — AB
SPECIFIC GRAVITY, URINE: 1.025 (ref 1.005–1.030)
pH: 8.5 — ABNORMAL HIGH (ref 5.0–8.0)

## 2018-10-13 MED ORDER — AZITHROMYCIN 250 MG PO TABS
1000.0000 mg | ORAL_TABLET | Freq: Once | ORAL | Status: AC
  Administered 2018-10-13: 1000 mg via ORAL
  Filled 2018-10-13: qty 4

## 2018-10-13 MED ORDER — CEFTRIAXONE SODIUM 250 MG IJ SOLR
250.0000 mg | Freq: Once | INTRAMUSCULAR | Status: AC
  Administered 2018-10-13: 250 mg via INTRAMUSCULAR
  Filled 2018-10-13: qty 250

## 2018-10-13 NOTE — Discharge Instructions (Signed)
You had STD testing completed today and will be called in 2 to 3 days with any positive results.  Your prophylactically treated for gonorrhea and chlamydia today.  If you are called with any positive results you will need to notify any partners so they can be seen and tested as well.  Make sure you are using protection.  You can go to the health department for any further STD testing needs.
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
## 2018-10-13 NOTE — ED Notes (Addendum)
Pt remains in bathroom, states "I think that medicine upset my stomach."

## 2018-10-13 NOTE — ED Triage Notes (Signed)
Pt states he had unprotected sex last week and has had irritation of his penis since then.

## 2018-10-14 LAB — RPR: RPR: NONREACTIVE

## 2018-10-14 LAB — HIV ANTIBODY (ROUTINE TESTING W REFLEX): HIV SCREEN 4TH GENERATION: NONREACTIVE

## 2018-10-16 LAB — GC/CHLAMYDIA PROBE AMP (~~LOC~~) NOT AT ARMC
Chlamydia: NEGATIVE
Neisseria Gonorrhea: POSITIVE — AB

## 2018-12-01 ENCOUNTER — Emergency Department (HOSPITAL_BASED_OUTPATIENT_CLINIC_OR_DEPARTMENT_OTHER)
Admission: EM | Admit: 2018-12-01 | Discharge: 2018-12-01 | Disposition: A | Discharge status: Home / Self Care | Payer: Medicaid Other | Attending: Emergency Medicine | Admitting: Emergency Medicine

## 2018-12-01 ENCOUNTER — Other Ambulatory Visit: Payer: Self-pay

## 2018-12-01 ENCOUNTER — Encounter (HOSPITAL_BASED_OUTPATIENT_CLINIC_OR_DEPARTMENT_OTHER): Payer: Self-pay | Admitting: Emergency Medicine

## 2018-12-01 DIAGNOSIS — B353 Tinea pedis: Secondary | ICD-10-CM

## 2018-12-01 DIAGNOSIS — M79671 Pain in right foot: Secondary | ICD-10-CM | POA: Diagnosis present

## 2018-12-01 MED ORDER — IBUPROFEN 600 MG PO TABS: 600.0000 mg | ORAL_TABLET | Freq: Four times a day (QID) | ORAL | 0 refills | Status: DC | PRN

## 2018-12-01 MED ORDER — CLOTRIMAZOLE 1 % EX CREA: 1.0000 "application " | TOPICAL_CREAM | Freq: Two times a day (BID) | CUTANEOUS | 0 refills | Status: DC

## 2018-12-01 MED ORDER — IBUPROFEN 400 MG PO TABS
600.0000 mg | ORAL_TABLET | Freq: Once | ORAL | Status: AC
  Administered 2018-12-01: 600 mg via ORAL
  Filled 2018-12-01: qty 1
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
## 2018-12-01 NOTE — ED Notes (Signed)
ED Provider at bedside. 

## 2018-12-01 NOTE — ED Triage Notes (Signed)
Crack between right 4th and 5th toe that pt noticed last night. [facility]

## 2018-12-24 ENCOUNTER — Other Ambulatory Visit: Payer: Self-pay

## 2018-12-24 ENCOUNTER — Emergency Department (HOSPITAL_BASED_OUTPATIENT_CLINIC_OR_DEPARTMENT_OTHER)
Admission: EM | Admit: 2018-12-24 | Discharge: 2018-12-24 | Disposition: A | Discharge status: Home / Self Care | Payer: Medicaid Other | Attending: Emergency Medicine | Admitting: Emergency Medicine

## 2018-12-24 ENCOUNTER — Encounter (HOSPITAL_BASED_OUTPATIENT_CLINIC_OR_DEPARTMENT_OTHER): Payer: Self-pay | Admitting: Emergency Medicine

## 2018-12-24 DIAGNOSIS — Z202 Contact with and (suspected) exposure to infections with a predominantly sexual mode of transmission: Secondary | ICD-10-CM | POA: Insufficient documentation

## 2018-12-24 LAB — URINALYSIS, ROUTINE W REFLEX MICROSCOPIC
BILIRUBIN URINE: NEGATIVE
Glucose, UA: NEGATIVE mg/dL
HGB URINE DIPSTICK: NEGATIVE
KETONES UR: NEGATIVE mg/dL
Leukocytes, UA: NEGATIVE
NITRITE: NEGATIVE
Protein, ur: NEGATIVE mg/dL
Specific Gravity, Urine: 1.01 (ref 1.005–1.030)
pH: 7.5 (ref 5.0–8.0)

## 2018-12-24 MED ORDER — AZITHROMYCIN 250 MG PO TABS
1000.0000 mg | ORAL_TABLET | Freq: Once | ORAL | Status: AC
  Administered 2018-12-24: 1000 mg via ORAL
  Filled 2018-12-24: qty 4

## 2018-12-24 MED ORDER — CEFTRIAXONE SODIUM 250 MG IJ SOLR
250.0000 mg | Freq: Once | INTRAMUSCULAR | Status: AC
  Administered 2018-12-24: 250 mg via INTRAMUSCULAR
  Filled 2018-12-24: qty 250
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
## 2018-12-24 NOTE — Discharge Instructions (Signed)
You were treated prophylactically today for gonorrhea and chlamydia and have STD testing pending you will be contacted by phone in 2 to 3 days with any positive results at which time he should notify any partners so they can be tested and treated as well.  You can follow-up at the health department in the future for any STD testing needs.  Please make sure you are using protection to protect yourself from STDs.

## 2018-12-24 NOTE — ED Triage Notes (Signed)
States his partner has vaginal discharge and he would like to "be checked out too"

## 2018-12-25 LAB — SYPHILIS: RPR W/REFLEX TO RPR TITER AND TREPONEMAL ANTIBODIES, TRADITIONAL SCREENING AND DIAGNOSIS ALGORITHM: RPR Ser Ql: NONREACTIVE

## 2018-12-25 LAB — GC/CHLAMYDIA PROBE AMP (~~LOC~~) NOT AT ARMC
Chlamydia: NEGATIVE
Neisseria Gonorrhea: NEGATIVE

## 2018-12-25 LAB — HIV ANTIBODY (ROUTINE TESTING W REFLEX): HIV Screen 4th Generation wRfx: NONREACTIVE

## 2019-01-01 ENCOUNTER — Emergency Department (HOSPITAL_COMMUNITY)
Admission: EM | Admit: 2019-01-01 | Discharge: 2019-01-01 | Discharge status: Against Medical Advice | Payer: Medicaid Other

## 2019-05-25 ENCOUNTER — Encounter (HOSPITAL_COMMUNITY): Payer: Self-pay | Admitting: *Deleted

## 2019-05-25 ENCOUNTER — Other Ambulatory Visit: Payer: Self-pay

## 2019-05-25 ENCOUNTER — Ambulatory Visit (HOSPITAL_COMMUNITY)
Admission: EM | Admit: 2019-05-25 | Discharge: 2019-05-25 | Disposition: A | Discharge status: Home / Self Care | Payer: Medicaid Other | Attending: Internal Medicine | Admitting: Internal Medicine

## 2019-05-25 DIAGNOSIS — W57XXXA Bitten or stung by nonvenomous insect and other nonvenomous arthropods, initial encounter: Secondary | ICD-10-CM | POA: Diagnosis not present

## 2019-05-25 DIAGNOSIS — S40861A Insect bite (nonvenomous) of right upper arm, initial encounter: Secondary | ICD-10-CM | POA: Diagnosis not present

## 2019-05-25 DIAGNOSIS — T7840XA Allergy, unspecified, initial encounter: Secondary | ICD-10-CM | POA: Diagnosis not present

## 2019-05-25 MED ORDER — DIPHENHYDRAMINE HCL 25 MG PO CAPS
ORAL_CAPSULE | ORAL | Status: AC
  Filled 2019-05-25: qty 1

## 2019-05-25 MED ORDER — DIPHENHYDRAMINE HCL 25 MG PO TABS: 25.0000 mg | ORAL_TABLET | Freq: Four times a day (QID) | ORAL | 0 refills | Status: DC | PRN

## 2019-05-25 MED ORDER — DEXAMETHASONE SODIUM PHOSPHATE 10 MG/ML IJ SOLN: 10.0000 mg | Freq: Once | INTRAMUSCULAR | Status: DC

## 2019-05-25 MED ORDER — DEXAMETHASONE SODIUM PHOSPHATE 10 MG/ML IJ SOLN
10.0000 mg | Freq: Once | INTRAMUSCULAR | Status: AC
  Administered 2019-05-25: 10 mg via INTRAMUSCULAR

## 2019-05-25 MED ORDER — PREDNISONE 10 MG PO TABS: 20.0000 mg | ORAL_TABLET | Freq: Every day | ORAL | 0 refills | Status: AC

## 2019-05-25 MED ORDER — FAMOTIDINE 20 MG PO TABS
ORAL_TABLET | ORAL | Status: AC
  Filled 2019-05-25: qty 1

## 2019-05-25 MED ORDER — FAMOTIDINE 20 MG PO TABS
20.0000 mg | ORAL_TABLET | Freq: Once | ORAL | Status: AC
  Administered 2019-05-25: 20 mg via ORAL

## 2019-05-25 MED ORDER — DIPHENHYDRAMINE HCL 25 MG PO CAPS
25.0000 mg | ORAL_CAPSULE | Freq: Once | ORAL | Status: AC
  Administered 2019-05-25: 25 mg via ORAL

## 2019-05-25 MED ORDER — DEXAMETHASONE SODIUM PHOSPHATE 10 MG/ML IJ SOLN
INTRAMUSCULAR | Status: AC
  Filled 2019-05-25: qty 1

## 2019-05-25 MED ORDER — FAMOTIDINE 20 MG PO TABS: 20.0000 mg | ORAL_TABLET | Freq: Two times a day (BID) | ORAL | 0 refills | Status: DC

## 2019-05-25 NOTE — ED Triage Notes (Signed)
Rapid waiting room eval per Dr Lanny Cramp. Reports seeing a black spider bite right upper arm yesterday. Pt appears highly anxious.  C/O "I feel weird", c/o feeling like throat is closing, and heart is beating hard.  Pt talking excessively; no oral swelling noted.
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
## 2019-06-03 ENCOUNTER — Encounter (HOSPITAL_COMMUNITY): Payer: Self-pay | Admitting: Emergency Medicine

## 2019-06-03 ENCOUNTER — Ambulatory Visit (HOSPITAL_COMMUNITY)
Admission: EM | Admit: 2019-06-03 | Discharge: 2019-06-03 | Disposition: A | Discharge status: Home / Self Care | Payer: Medicaid Other | Attending: Emergency Medicine | Admitting: Emergency Medicine

## 2019-06-03 ENCOUNTER — Other Ambulatory Visit: Payer: Self-pay

## 2019-06-03 DIAGNOSIS — L02413 Cutaneous abscess of right upper limb: Secondary | ICD-10-CM

## 2019-06-03 DIAGNOSIS — L0291 Cutaneous abscess, unspecified: Secondary | ICD-10-CM

## 2019-06-03 DIAGNOSIS — M79621 Pain in right upper arm: Secondary | ICD-10-CM | POA: Diagnosis not present

## 2019-06-03 MED ORDER — DOXYCYCLINE HYCLATE 100 MG PO CAPS: 100.0000 mg | ORAL_CAPSULE | Freq: Two times a day (BID) | ORAL | 0 refills | Status: DC

## 2019-06-03 MED ORDER — LIDOCAINE HCL (PF) 2 % IJ SOLN
INTRAMUSCULAR | Status: AC
  Filled 2019-06-03: qty 2

## 2019-06-03 NOTE — ED Triage Notes (Signed)
Per pt he was here last week for spider bite on his right arm and says it is not any better. Pt says when he pushes on it it cause SOB. PT HAS NO FEVERS

## 2019-06-03 NOTE — Discharge Instructions (Signed)
Warm compresses 3-4 times a day to promote further drainage.  Ibuprofen or tylenol as needed for pain.  May keep this dressing in place until tomorrow night.  Then wash daily with soap and water. Avoid touching to keep clean.  Complete course of antibiotics.  If worsening or no improvement please don't hesitate to return.
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
## 2019-06-25 ENCOUNTER — Other Ambulatory Visit: Payer: Self-pay

## 2019-06-25 ENCOUNTER — Ambulatory Visit (HOSPITAL_COMMUNITY)
Admission: EM | Admit: 2019-06-25 | Discharge: 2019-06-25 | Disposition: A | Discharge status: Home / Self Care | Payer: Medicaid Other | Attending: Family Medicine | Admitting: Family Medicine

## 2019-06-25 ENCOUNTER — Encounter (HOSPITAL_COMMUNITY): Payer: Self-pay

## 2019-06-25 DIAGNOSIS — K047 Periapical abscess without sinus: Secondary | ICD-10-CM

## 2019-06-25 MED ORDER — TRAMADOL HCL 50 MG PO TABS: 50.0000 mg | ORAL_TABLET | Freq: Two times a day (BID) | ORAL | 0 refills | Status: DC | PRN

## 2019-06-25 MED ORDER — PENICILLIN V POTASSIUM 500 MG PO TABS: 500.0000 mg | ORAL_TABLET | Freq: Four times a day (QID) | ORAL | 0 refills | Status: AC

## 2019-06-25 MED ORDER — IBUPROFEN 800 MG PO TABS: 800.0000 mg | ORAL_TABLET | Freq: Three times a day (TID) | ORAL | 0 refills | Status: DC

## 2019-06-25 NOTE — Discharge Instructions (Addendum)
Take the antibiotics as prescribed for dental abscess Ibuprofen 800 mg for mild to moderate pain Tramadol for severe pain  Dental resources given
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
## 2019-06-25 NOTE — ED Triage Notes (Signed)
Patient presents to Urgent Care with complaints of upper left dental pain since last week. Patient reports he would like something for pain and an antibiotic.

## 2019-07-17 ENCOUNTER — Emergency Department (HOSPITAL_COMMUNITY)
Admission: EM | Admit: 2019-07-17 | Discharge: 2019-07-17 | Disposition: A | Discharge status: Home / Self Care | Payer: Medicaid Other | Attending: Emergency Medicine | Admitting: Emergency Medicine

## 2019-07-17 ENCOUNTER — Emergency Department (HOSPITAL_COMMUNITY): Payer: Medicaid Other

## 2019-07-17 ENCOUNTER — Other Ambulatory Visit: Payer: Self-pay

## 2019-07-17 DIAGNOSIS — M62838 Other muscle spasm: Secondary | ICD-10-CM | POA: Insufficient documentation

## 2019-07-17 DIAGNOSIS — F17290 Nicotine dependence, other tobacco product, uncomplicated: Secondary | ICD-10-CM | POA: Insufficient documentation

## 2019-07-17 DIAGNOSIS — R0789 Other chest pain: Secondary | ICD-10-CM | POA: Insufficient documentation

## 2019-07-17 LAB — CBC
HCT: 46.5 % (ref 39.0–52.0)
Hemoglobin: 15.9 g/dL (ref 13.0–17.0)
MCH: 29.1 pg (ref 26.0–34.0)
MCHC: 34.2 g/dL (ref 30.0–36.0)
MCV: 85.2 fL (ref 80.0–100.0)
Platelets: 150 10*3/uL (ref 150–400)
RBC: 5.46 MIL/uL (ref 4.22–5.81)
RDW: 12.1 % (ref 11.5–15.5)
WBC: 4.9 10*3/uL (ref 4.0–10.5)
nRBC: 0 % (ref 0.0–0.2)

## 2019-07-17 LAB — BASIC METABOLIC PANEL
Anion gap: 11 (ref 5–15)
BUN: 16 mg/dL (ref 6–20)
CO2: 23 mmol/L (ref 22–32)
Calcium: 9.4 mg/dL (ref 8.9–10.3)
Chloride: 102 mmol/L (ref 98–111)
Creatinine, Ser: 1.08 mg/dL (ref 0.61–1.24)
GFR calc Af Amer: >60 mL/min (ref >60)
GFR calc non Af Amer: >60 mL/min (ref >60)
Glucose, Bld: 96 mg/dL (ref 70–99)
Potassium: 3.7 mmol/L (ref 3.5–5.1)
Sodium: 136 mmol/L (ref 135–145)

## 2019-07-17 LAB — TROPONIN I (HIGH SENSITIVITY)
Troponin I (High Sensitivity): 4 ng/L (ref <18)
Troponin I (High Sensitivity): 2 ng/L (ref <18)

## 2019-07-17 MED ORDER — LIDOCAINE 5 % EX PTCH
1.0000 | MEDICATED_PATCH | Freq: Once | CUTANEOUS | Status: DC
  Administered 2019-07-17: 21:00:00 1 via TRANSDERMAL
  Filled 2019-07-17: qty 1
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
## 2019-07-17 NOTE — ED Notes (Signed)
Patient Alert and oriented to baseline. Stable and ambulatory to baseline. Patient verbalized understanding of the discharge instructions.  Patient belongings were taken by the patient.   

## 2019-07-17 NOTE — Discharge Instructions (Addendum)
You were seen in the ED today for unspecified chest pain Your EKG, chest x ray, and labwork were all very reassuring today We have given you a lidocaine patch to use for pain and applied it in the ED Please keep appointment with your PCP scheduled for next Monday for further evaluation

## 2019-07-17 NOTE — ED Triage Notes (Addendum)
Pt arrives POV for eval of generalized body spasms, focused in chest. Pt states he "isn't breathing right", pt is satting 100% on RA w/ resps even and unlabored. Pt reports pain is worse in L sided chest, states onset yesterday. Pt reports that the spasms come and "make him lose control". Pt reports +IVDU yesterday, states use :"ice" likely meth.  Pt appears to be twitching arms during triage and stating "see, see i'm spasming". Verbal reassurance provided. Pt reports his body is "full of fluid". No obvious edema noted on exam.

## 2019-07-19 ENCOUNTER — Emergency Department (HOSPITAL_COMMUNITY)
Admission: EM | Admit: 2019-07-19 | Discharge: 2019-07-19 | Disposition: A | Discharge status: Home / Self Care | Payer: Medicaid Other | Attending: Emergency Medicine | Admitting: Emergency Medicine

## 2019-07-19 ENCOUNTER — Emergency Department (HOSPITAL_COMMUNITY): Payer: Medicaid Other

## 2019-07-19 ENCOUNTER — Other Ambulatory Visit: Payer: Self-pay

## 2019-07-19 ENCOUNTER — Encounter (HOSPITAL_COMMUNITY): Payer: Self-pay

## 2019-07-19 DIAGNOSIS — E876 Hypokalemia: Secondary | ICD-10-CM

## 2019-07-19 DIAGNOSIS — F17290 Nicotine dependence, other tobacco product, uncomplicated: Secondary | ICD-10-CM | POA: Diagnosis not present

## 2019-07-19 DIAGNOSIS — R0789 Other chest pain: Secondary | ICD-10-CM | POA: Diagnosis present

## 2019-07-19 DIAGNOSIS — M62838 Other muscle spasm: Secondary | ICD-10-CM | POA: Insufficient documentation

## 2019-07-19 LAB — D-DIMER, QUANTITATIVE: D-Dimer, Quant: 0.56 ug/mL-FEU — ABNORMAL HIGH (ref 0.00–0.50)

## 2019-07-19 LAB — COMPREHENSIVE METABOLIC PANEL
ALT: 31 U/L (ref 0–44)
AST: 27 U/L (ref 15–41)
Albumin: 4.3 g/dL (ref 3.5–5.0)
Alkaline Phosphatase: 41 U/L (ref 38–126)
Anion gap: 12 (ref 5–15)
BUN: 10 mg/dL (ref 6–20)
CO2: 25 mmol/L (ref 22–32)
Calcium: 9.4 mg/dL (ref 8.9–10.3)
Chloride: 101 mmol/L (ref 98–111)
Creatinine, Ser: 1.17 mg/dL (ref 0.61–1.24)
GFR calc Af Amer: >60 mL/min (ref >60)
GFR calc non Af Amer: >60 mL/min (ref >60)
Glucose, Bld: 96 mg/dL (ref 70–99)
Potassium: 3.3 mmol/L — ABNORMAL LOW (ref 3.5–5.1)
Sodium: 138 mmol/L (ref 135–145)
Total Bilirubin: 0.7 mg/dL (ref 0.3–1.2)
Total Protein: 8.1 g/dL (ref 6.5–8.1)

## 2019-07-19 LAB — CBC WITH DIFFERENTIAL/PLATELET
Abs Immature Granulocytes: 0.01 10*3/uL (ref 0.00–0.07)
Basophils Absolute: 0 10*3/uL (ref 0.0–0.1)
Basophils Relative: 0 %
Eosinophils Absolute: 0 10*3/uL (ref 0.0–0.5)
Eosinophils Relative: 1 %
HCT: 45.8 % (ref 39.0–52.0)
Hemoglobin: 15.6 g/dL (ref 13.0–17.0)
Immature Granulocytes: 0 %
Lymphocytes Relative: 41 %
Lymphs Abs: 1.8 10*3/uL (ref 0.7–4.0)
MCH: 29 pg (ref 26.0–34.0)
MCHC: 34.1 g/dL (ref 30.0–36.0)
MCV: 85.1 fL (ref 80.0–100.0)
Monocytes Absolute: 0.4 10*3/uL (ref 0.1–1.0)
Monocytes Relative: 10 %
Neutro Abs: 2.1 10*3/uL (ref 1.7–7.7)
Neutrophils Relative %: 48 %
Platelets: 164 10*3/uL (ref 150–400)
RBC: 5.38 MIL/uL (ref 4.22–5.81)
RDW: 12.3 % (ref 11.5–15.5)
WBC: 4.4 10*3/uL (ref 4.0–10.5)
nRBC: 0 % (ref 0.0–0.2)

## 2019-07-19 LAB — TSH: TSH: 0.339 u[IU]/mL — ABNORMAL LOW (ref 0.350–4.500)

## 2019-07-19 LAB — LIPASE, BLOOD: Lipase: 29 U/L (ref 11–51)

## 2019-07-19 LAB — TROPONIN I (HIGH SENSITIVITY)
Troponin I (High Sensitivity): 4 ng/L (ref <18)
Troponin I (High Sensitivity): 4 ng/L (ref <18)

## 2019-07-19 LAB — MAGNESIUM: Magnesium: 2.1 mg/dL (ref 1.7–2.4)

## 2019-07-19 MED ORDER — GADOBUTROL 1 MMOL/ML IV SOLN
10.0000 mL | Freq: Once | INTRAVENOUS | Status: AC | PRN
  Administered 2019-07-19: 10 mL via INTRAVENOUS

## 2019-07-19 MED ORDER — SODIUM CHLORIDE (PF) 0.9 % IJ SOLN
INTRAMUSCULAR | Status: AC
  Filled 2019-07-19: qty 50

## 2019-07-19 MED ORDER — IOHEXOL 350 MG/ML SOLN
100.0000 mL | Freq: Once | INTRAVENOUS | Status: AC | PRN
  Administered 2019-07-19: 100 mL via INTRAVENOUS

## 2019-07-19 MED ORDER — POTASSIUM CHLORIDE CRYS ER 20 MEQ PO TBCR: 20.0000 meq | EXTENDED_RELEASE_TABLET | Freq: Two times a day (BID) | ORAL | 0 refills | Status: AC

## 2019-07-19 MED ORDER — POTASSIUM CHLORIDE CRYS ER 20 MEQ PO TBCR
40.0000 meq | EXTENDED_RELEASE_TABLET | Freq: Once | ORAL | Status: AC
  Administered 2019-07-19: 20:00:00 40 meq via ORAL
  Filled 2019-07-19: qty 2

## 2019-07-19 MED ORDER — CYCLOBENZAPRINE HCL 10 MG PO TABS
10.0000 mg | ORAL_TABLET | Freq: Once | ORAL | Status: AC
  Administered 2019-07-19: 10 mg via ORAL
  Filled 2019-07-19: qty 1

## 2019-07-19 MED ORDER — CYCLOBENZAPRINE HCL 10 MG PO TABS: 10.0000 mg | ORAL_TABLET | Freq: Two times a day (BID) | ORAL | 0 refills | Status: DC | PRN

## 2019-07-19 NOTE — ED Triage Notes (Signed)
Pt BIBA from home. Pt c/o left sided abd pain and spasms.  Pt states that the pain is worse today than on 8/11.
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
## 2019-07-19 NOTE — Discharge Instructions (Signed)
Your work-up today showed low potassium but otherwise was reassuring.  We did not find evidence of a problem with your neck or any evidence of blood clot or other intrinsic chest abnormality.  Her exam was consistent with muscle spasms and your low potassium is likely contributing.  We help correct her potassium today, please take potassium medication for the neck several days.  Please use the muscle relaxant help with the spasms.  Please rest and stay hydrated.  Please follow-up with your primary doctor next week as we discussed.  If any symptoms change or worsen, please return to the emergency department.

## 2019-07-19 NOTE — ED Notes (Signed)
Unable to obtain second set of blood cultures.  

## 2019-07-23 ENCOUNTER — Ambulatory Visit (INDEPENDENT_AMBULATORY_CARE_PROVIDER_SITE_OTHER): Payer: Medicaid Other | Admitting: Family Medicine

## 2019-07-23 ENCOUNTER — Other Ambulatory Visit: Payer: Self-pay

## 2019-07-23 ENCOUNTER — Encounter: Payer: Self-pay | Admitting: Family Medicine

## 2019-07-23 VITALS — BP 131/61 | HR 95 | Temp 98.4°F | Resp 20 | Wt 236.0 lb

## 2019-07-23 DIAGNOSIS — E876 Hypokalemia: Secondary | ICD-10-CM

## 2019-07-23 DIAGNOSIS — F1910 Other psychoactive substance abuse, uncomplicated: Secondary | ICD-10-CM | POA: Diagnosis not present

## 2019-07-23 DIAGNOSIS — G629 Polyneuropathy, unspecified: Secondary | ICD-10-CM | POA: Diagnosis not present

## 2019-07-23 DIAGNOSIS — R079 Chest pain, unspecified: Secondary | ICD-10-CM | POA: Diagnosis not present

## 2019-07-23 MED ORDER — GABAPENTIN 300 MG PO CAPS: 300.0000 mg | ORAL_CAPSULE | Freq: Three times a day (TID) | ORAL | 3 refills | Status: DC

## 2019-07-23 MED ORDER — BACLOFEN 10 MG PO TABS: 10.0000 mg | ORAL_TABLET | Freq: Three times a day (TID) | ORAL | 0 refills | Status: DC

## 2019-07-23 MED ORDER — NAPROXEN 500 MG PO TABS: 500.0000 mg | ORAL_TABLET | Freq: Two times a day (BID) | ORAL | 0 refills | Status: DC

## 2019-07-23 NOTE — Patient Instructions (Addendum)
I started you on gabapentin. This medication will help with nerve pain. Even if you do not feel that it is working, please continue to take it as prescribed. It can take a little while to get into your system. Someone will call you with your MRI appointment.    Neuropathic Pain Neuropathic pain is pain caused by damage to the nerves that are responsible for certain sensations in your body (sensory nerves). The pain can be caused by:  Damage to the sensory nerves that send signals to your spinal cord and brain (peripheral nervous system).  Damage to the sensory nerves in your brain or spinal cord (central nervous system). Neuropathic pain can make you more sensitive to pain. Even a minor sensation can feel very painful. This is usually a long-term condition that can be difficult to treat. The type of pain differs from person to person. It may:  Start suddenly (acute), or it may develop slowly and last for a long time (chronic).  Come and go as damaged nerves heal, or it may stay at the same level for years.  Cause emotional distress, loss of sleep, and a lower quality of life. What are the causes? The most common cause of this condition is diabetes. Many other diseases and conditions can also cause neuropathic pain. Causes of neuropathic pain can be classified as:  Toxic. This is caused by medicines and chemicals. The most common cause of toxic neuropathic pain is damage from cancer treatments (chemotherapy).  Metabolic. This can be caused by: ? Diabetes. This is the most common disease that damages the nerves. ? Lack of vitamin B from long-term alcohol abuse.  Traumatic. Any injury that cuts, crushes, or stretches a nerve can cause damage and pain. A common example is feeling pain after losing an arm or leg (phantom limb pain).  Compression-related. If a sensory nerve gets trapped or compressed for a long period of time, the blood supply to the nerve can be cut off.  Vascular. Many blood  vessel diseases can cause neuropathic pain by decreasing blood supply and oxygen to nerves.  Autoimmune. This type of pain results from diseases in which the body's defense system (immune system) mistakenly attacks sensory nerves. Examples of autoimmune diseases that can cause neuropathic pain include lupus and multiple sclerosis.  Infectious. Many types of viral infections can damage sensory nerves and cause pain. Shingles infection is a common cause of this type of pain.  Inherited. Neuropathic pain can be a symptom of many diseases that are passed down through families (genetic). What increases the risk? You are more likely to develop this condition if:  You have diabetes.  You smoke.  You drink too much alcohol.  You are taking certain medicines, including medicines that kill cancer cells (chemotherapy) or that treat immune system disorders. What are the signs or symptoms? The main symptom is pain. Neuropathic pain is often described as:  Burning.  Shock-like.  Stinging.  Hot or cold.  Itching. How is this diagnosed? No single test can diagnose neuropathic pain. It is diagnosed based on:  Physical exam and your symptoms. Your health care provider will ask you about your pain. You may be asked to use a pain scale to describe how bad your pain is.  Tests. These may be done to see if you have a high sensitivity to pain and to help find the cause and location of any sensory nerve damage. They include: ? Nerve conduction studies to test how well nerve signals travel  through your sensory nerves (electrodiagnostic testing). ? Stimulating your sensory nerves through electrodes on your skin and measuring the response in your spinal cord and brain (somatosensory evoked potential).  Imaging studies, such as: ? X-rays. ? CT scan. ? MRI. How is this treated? Treatment for neuropathic pain may change over time. You may need to try different treatment options or a combination of  treatments. Some options include:  Treating the underlying cause of the neuropathy, such as diabetes, kidney disease, or vitamin deficiencies.  Stopping medicines that can cause neuropathy, such as chemotherapy.  Medicine to relieve pain. Medicines may include: ? Prescription or over-the-counter pain medicine. ? Anti-seizure medicine. ? Antidepressant medicines. ? Pain-relieving patches that are applied to painful areas of skin. ? A medicine to numb the area (local anesthetic), which can be injected as a nerve block.  Transcutaneous nerve stimulation. This uses electrical currents to block painful nerve signals. The treatment is painless.  Alternative treatments, such as: ? Acupuncture. ? Meditation. ? Massage. ? Physical therapy. ? Pain management programs. ? Counseling. Follow these instructions at home: Medicines   Take over-the-counter and prescription medicines only as told by your health care provider.  Do not drive or use heavy machinery while taking prescription pain medicine.  If you are taking prescription pain medicine, take actions to prevent or treat constipation. Your health care provider may recommend that you: ? Drink enough fluid to keep your urine pale yellow. ? Eat foods that are high in fiber, such as fresh fruits and vegetables, whole grains, and beans. ? Limit foods that are high in fat and processed sugars, such as fried or sweet foods. ? Take an over-the-counter or prescription medicine for constipation. Lifestyle   Have a good support system at home.  Consider joining a chronic pain support group.  Do not use any products that contain nicotine or tobacco, such as cigarettes and e-cigarettes. If you need help quitting, ask your health care provider.  Do not drink alcohol. General instructions  Learn as much as you can about your condition.  Work closely with all your health care providers to find the treatment plan that works best for you.   Ask your health care provider what activities are safe for you.  Keep all follow-up visits as told by your health care provider. This is important. Contact a health care provider if:  Your pain treatments are not working.  You are having side effects from your medicines.  You are struggling with tiredness (fatigue), mood changes, depression, or anxiety. Summary  Neuropathic pain is pain caused by damage to the nerves that are responsible for certain sensations in your body (sensory nerves).  Neuropathic pain may come and go as damaged nerves heal, or it may stay at the same level for years.  Neuropathic pain is usually a long-term condition that can be difficult to treat. Consider joining a chronic pain support group. This information is not intended to replace advice given to you by your health care provider. Make sure you discuss any questions you have with your health care provider. Document Released: 08/19/2004 Document Revised: 03/15/2019 Document Reviewed: 12/09/2017 Elsevier Patient Education  2020 [employer]
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
## 2019-07-24 LAB — CBC WITH DIFFERENTIAL
Basophils Absolute: 0 10*3/uL (ref 0.0–0.2)
Basos: 0 %
EOS (ABSOLUTE): 0.1 10*3/uL (ref 0.0–0.4)
Eos: 2 %
Hematocrit: 42.8 % (ref 37.5–51.0)
Hemoglobin: 14.9 g/dL (ref 13.0–17.7)
Immature Grans (Abs): 0 10*3/uL (ref 0.0–0.1)
Immature Granulocytes: 0 %
Lymphocytes Absolute: 1.4 10*3/uL (ref 0.7–3.1)
Lymphs: 45 %
MCH: 28.7 pg (ref 26.6–33.0)
MCHC: 34.8 g/dL (ref 31.5–35.7)
MCV: 82 fL (ref 79–97)
Monocytes Absolute: 0.3 10*3/uL (ref 0.1–0.9)
Monocytes: 9 %
Neutrophils Absolute: 1.4 10*3/uL (ref 1.4–7.0)
Neutrophils: 44 %
RBC: 5.2 x10E6/uL (ref 4.14–5.80)
RDW: 11.9 % (ref 11.6–15.4)
WBC: 3.2 10*3/uL — ABNORMAL LOW (ref 3.4–10.8)

## 2019-07-24 LAB — COMPREHENSIVE METABOLIC PANEL
ALT: 19 IU/L (ref 0–44)
AST: 16 IU/L (ref 0–40)
Albumin/Globulin Ratio: 1.3 (ref 1.2–2.2)
Albumin: 3.8 g/dL — ABNORMAL LOW (ref 4.1–5.2)
Alkaline Phosphatase: 43 IU/L (ref 39–117)
BUN/Creatinine Ratio: 11 (ref 9–20)
BUN: 13 mg/dL (ref 6–20)
Bilirubin Total: 0.3 mg/dL (ref 0.0–1.2)
CO2: 26 mmol/L (ref 20–29)
Calcium: 8.9 mg/dL (ref 8.7–10.2)
Chloride: 100 mmol/L (ref 96–106)
Creatinine, Ser: 1.15 mg/dL (ref 0.76–1.27)
GFR calc Af Amer: 98 mL/min/{1.73_m2} (ref >59)
GFR calc non Af Amer: 85 mL/min/{1.73_m2} (ref >59)
Globulin, Total: 3 g/dL (ref 1.5–4.5)
Glucose: 77 mg/dL (ref 65–99)
Potassium: 4.8 mmol/L (ref 3.5–5.2)
Sodium: 137 mmol/L (ref 134–144)
Total Protein: 6.8 g/dL (ref 6.0–8.5)

## 2019-07-24 LAB — CULTURE, BLOOD (ROUTINE X 2)
Culture: NO GROWTH
Culture: NO GROWTH

## 2019-07-24 LAB — B12 AND FOLATE PANEL
Folate: 8.1 ng/mL (ref >3.0)
Vitamin B-12: 309 pg/mL (ref 232–1245)

## 2019-07-30 ENCOUNTER — Encounter: Payer: Self-pay | Admitting: Family Medicine

## 2019-07-30 ENCOUNTER — Telehealth: Payer: Self-pay

## 2019-07-30 NOTE — Telephone Encounter (Signed)
-----   Message from Lanae Boast, [place] sent at 07/30/2019  1:24 PM EDT ----- The  labs are stable without significant clinical change.  All other results are normal or within acceptable limits. No Medication changes  Please check and see if his MRI has been scheduled.

## 2019-07-30 NOTE — Telephone Encounter (Signed)
Called, no answer, no voicemail. MRI was denied by insurance, we will be unable to schedule this.

## 2019-08-06 ENCOUNTER — Ambulatory Visit: Payer: Self-pay | Admitting: Family Medicine

## 2019-08-15 ENCOUNTER — Encounter (HOSPITAL_COMMUNITY): Payer: Self-pay

## 2019-08-15 ENCOUNTER — Encounter (HOSPITAL_COMMUNITY): Payer: Self-pay | Admitting: *Deleted

## 2019-08-20 ENCOUNTER — Ambulatory Visit (INDEPENDENT_AMBULATORY_CARE_PROVIDER_SITE_OTHER): Payer: Medicaid Other | Admitting: Family Medicine

## 2019-08-20 ENCOUNTER — Other Ambulatory Visit: Payer: Self-pay

## 2019-08-20 ENCOUNTER — Encounter: Payer: Self-pay | Admitting: Family Medicine

## 2019-08-20 VITALS — BP 102/62 | HR 77 | Temp 98.0°F | Resp 16 | Ht 76.0 in | Wt 233.0 lb

## 2019-08-20 DIAGNOSIS — M792 Neuralgia and neuritis, unspecified: Secondary | ICD-10-CM

## 2019-08-20 NOTE — Patient Instructions (Signed)
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
## 2019-09-24 ENCOUNTER — Other Ambulatory Visit: Payer: Self-pay

## 2019-09-24 ENCOUNTER — Ambulatory Visit (HOSPITAL_COMMUNITY)
Admission: EM | Admit: 2019-09-24 | Discharge: 2019-09-24 | Disposition: A | Discharge status: Home / Self Care | Payer: Medicaid Other | Attending: Emergency Medicine | Admitting: Emergency Medicine

## 2019-09-24 ENCOUNTER — Encounter (HOSPITAL_COMMUNITY): Payer: Self-pay

## 2019-09-24 DIAGNOSIS — Z79899 Other long term (current) drug therapy: Secondary | ICD-10-CM | POA: Insufficient documentation

## 2019-09-24 DIAGNOSIS — F17290 Nicotine dependence, other tobacco product, uncomplicated: Secondary | ICD-10-CM | POA: Insufficient documentation

## 2019-09-24 DIAGNOSIS — Z20828 Contact with and (suspected) exposure to other viral communicable diseases: Secondary | ICD-10-CM | POA: Diagnosis not present

## 2019-09-24 DIAGNOSIS — Z8249 Family history of ischemic heart disease and other diseases of the circulatory system: Secondary | ICD-10-CM | POA: Insufficient documentation

## 2019-09-24 DIAGNOSIS — R197 Diarrhea, unspecified: Secondary | ICD-10-CM | POA: Insufficient documentation

## 2019-09-24 NOTE — ED Triage Notes (Addendum)
Pt states he is having diarrhea, headache, body aches and weakness x 1 day. Pt wants a Covid test.
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
## 2019-09-24 NOTE — Discharge Instructions (Signed)
Your COVID test is pending.  You should self quarantine until your test result is back and is negative.   ° °Go to the emergency department if you develop high fever, shortness of breath, severe diarrhea, or other concerning symptoms.   ° °

## 2019-09-26 LAB — NOVEL CORONAVIRUS, NAA (HOSP ORDER, SEND-OUT TO REF LAB; TAT 18-24 HRS): SARS-CoV-2, NAA: NOT DETECTED

## 2019-10-19 ENCOUNTER — Ambulatory Visit (HOSPITAL_COMMUNITY)
Admission: EM | Admit: 2019-10-19 | Discharge: 2019-10-19 | Discharge status: Against Medical Advice | Payer: Medicaid Other | Attending: Emergency Medicine | Admitting: Emergency Medicine

## 2019-10-19 ENCOUNTER — Other Ambulatory Visit: Payer: Self-pay

## 2019-10-19 ENCOUNTER — Encounter (HOSPITAL_COMMUNITY): Payer: Self-pay

## 2019-10-19 DIAGNOSIS — F1910 Other psychoactive substance abuse, uncomplicated: Secondary | ICD-10-CM

## 2019-10-19 DIAGNOSIS — R002 Palpitations: Secondary | ICD-10-CM

## 2019-10-19 DIAGNOSIS — F419 Anxiety disorder, unspecified: Secondary | ICD-10-CM

## 2019-10-19 HISTORY — DX: Anxiety disorder, unspecified: F41.9

## 2019-10-19 NOTE — Discharge Instructions (Signed)
Your ekg looks well today.  We will check some blood work to check that there are no other heart complications. We get concerned about your heart with use of IV drugs.  Please seek assistance in discontinuing drug use

## 2019-10-19 NOTE — ED Triage Notes (Signed)
Pt presents with recurrent anxiety.

## 2019-10-19 NOTE — ED Notes (Addendum)
PT leaves without getting blood drawn. PT aware at the time of leaving that bloodwork is ordered and he can leave immediately after it's done and we will call him with results. PT states, "No. Just give me medicine for my anxiety and I'm leaving." PT made aware that we will not be starting him on any anxiety meds today per provider and that he should follow up with his PCP or go to ER if he has an emergency.
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
## 2019-10-26 ENCOUNTER — Ambulatory Visit (HOSPITAL_COMMUNITY)
Admission: EM | Admit: 2019-10-26 | Discharge: 2019-10-26 | Disposition: A | Discharge status: Home / Self Care | Payer: Medicaid Other

## 2019-10-26 ENCOUNTER — Other Ambulatory Visit: Payer: Self-pay

## 2019-10-26 NOTE — ED Notes (Signed)
Patient left prior to registration, unknown reason.

## 2019-11-24 ENCOUNTER — Other Ambulatory Visit: Payer: Self-pay

## 2019-11-24 ENCOUNTER — Emergency Department (HOSPITAL_COMMUNITY)
Admission: EM | Admit: 2019-11-24 | Discharge: 2019-11-24 | Disposition: A | Discharge status: Home / Self Care | Payer: Medicaid Other | Attending: Emergency Medicine | Admitting: Emergency Medicine

## 2019-11-24 ENCOUNTER — Ambulatory Visit (HOSPITAL_COMMUNITY)
Admission: EM | Admit: 2019-11-24 | Discharge: 2019-11-24 | Disposition: A | Discharge status: Home / Self Care | Payer: Medicaid Other | Attending: Emergency Medicine | Admitting: Emergency Medicine

## 2019-11-24 ENCOUNTER — Emergency Department (HOSPITAL_COMMUNITY): Payer: Medicaid Other

## 2019-11-24 ENCOUNTER — Encounter (HOSPITAL_COMMUNITY): Payer: Self-pay | Admitting: Emergency Medicine

## 2019-11-24 DIAGNOSIS — Z189 Retained foreign body fragments, unspecified material: Secondary | ICD-10-CM

## 2019-11-24 DIAGNOSIS — R0789 Other chest pain: Secondary | ICD-10-CM | POA: Insufficient documentation

## 2019-11-24 DIAGNOSIS — R0602 Shortness of breath: Secondary | ICD-10-CM | POA: Diagnosis not present

## 2019-11-24 DIAGNOSIS — F17210 Nicotine dependence, cigarettes, uncomplicated: Secondary | ICD-10-CM | POA: Insufficient documentation

## 2019-11-24 DIAGNOSIS — M7989 Other specified soft tissue disorders: Secondary | ICD-10-CM

## 2019-11-24 LAB — CBC WITH DIFFERENTIAL/PLATELET
Abs Immature Granulocytes: 0.03 10*3/uL (ref 0.00–0.07)
Basophils Absolute: 0 10*3/uL (ref 0.0–0.1)
Basophils Relative: 0 %
Eosinophils Absolute: 0 10*3/uL (ref 0.0–0.5)
Eosinophils Relative: 0 %
HCT: 43.6 % (ref 39.0–52.0)
Hemoglobin: 15 g/dL (ref 13.0–17.0)
Immature Granulocytes: 1 %
Lymphocytes Relative: 18 %
Lymphs Abs: 1.1 10*3/uL (ref 0.7–4.0)
MCH: 28.6 pg (ref 26.0–34.0)
MCHC: 34.4 g/dL (ref 30.0–36.0)
MCV: 83 fL (ref 80.0–100.0)
Monocytes Absolute: 0.6 10*3/uL (ref 0.1–1.0)
Monocytes Relative: 10 %
Neutro Abs: 4.5 10*3/uL (ref 1.7–7.7)
Neutrophils Relative %: 71 %
Platelets: 150 10*3/uL (ref 150–400)
RBC: 5.25 MIL/uL (ref 4.22–5.81)
RDW: 13.4 % (ref 11.5–15.5)
WBC: 6.2 10*3/uL (ref 4.0–10.5)
nRBC: 0 % (ref 0.0–0.2)

## 2019-11-24 LAB — COMPREHENSIVE METABOLIC PANEL
ALT: 29 U/L (ref 0–44)
AST: 41 U/L (ref 15–41)
Albumin: 4.3 g/dL (ref 3.5–5.0)
Alkaline Phosphatase: 43 U/L (ref 38–126)
Anion gap: 12 (ref 5–15)
BUN: 6 mg/dL (ref 6–20)
CO2: 23 mmol/L (ref 22–32)
Calcium: 9.4 mg/dL (ref 8.9–10.3)
Chloride: 103 mmol/L (ref 98–111)
Creatinine, Ser: 1.18 mg/dL (ref 0.61–1.24)
GFR calc Af Amer: >60 mL/min (ref >60)
GFR calc non Af Amer: >60 mL/min (ref >60)
Glucose, Bld: 101 mg/dL — ABNORMAL HIGH (ref 70–99)
Potassium: 3.7 mmol/L (ref 3.5–5.1)
Sodium: 138 mmol/L (ref 135–145)
Total Bilirubin: 1.3 mg/dL — ABNORMAL HIGH (ref 0.3–1.2)
Total Protein: 7.8 g/dL (ref 6.5–8.1)

## 2019-11-24 LAB — D-DIMER, QUANTITATIVE: D-Dimer, Quant: 0.49 ug/mL-FEU (ref 0.00–0.50)

## 2019-11-24 LAB — TROPONIN I (HIGH SENSITIVITY)
Troponin I (High Sensitivity): 5 ng/L (ref <18)
Troponin I (High Sensitivity): 5 ng/L (ref <18)

## 2019-11-24 MED ORDER — SULFAMETHOXAZOLE-TRIMETHOPRIM 800-160 MG PO TABS
1.0000 | ORAL_TABLET | Freq: Once | ORAL | Status: AC
  Administered 2019-11-24: 1 via ORAL
  Filled 2019-11-24: qty 1

## 2019-11-24 MED ORDER — SULFAMETHOXAZOLE-TRIMETHOPRIM 800-160 MG PO TABS: 1.0000 | ORAL_TABLET | Freq: Two times a day (BID) | ORAL | 0 refills | Status: AC

## 2019-11-24 MED ORDER — NAPROXEN 500 MG PO TABS: 500.0000 mg | ORAL_TABLET | Freq: Two times a day (BID) | ORAL | 0 refills | Status: DC

## 2019-11-24 NOTE — ED Notes (Signed)
Patient transported to X-ray 

## 2019-11-24 NOTE — ED Triage Notes (Signed)
Pt reports chest pain and SOB x "29 years".  Asked pt numerous times to pull mask up over nose and pt refuses.  Pt video recording full view of lobby.  Informed pt that he is not allowed to record in the hospital and he states that he is snap chatting.  Security called over to pt and he again states he is not recording. Pt now singing.  Refused to answer any other questions concerning chest pain.

## 2019-11-24 NOTE — ED Notes (Signed)
Patient specifically wants to know if he has a blood clot in his lung.  Patient says he has some chest discomfort and feeling tired and wants to know if he has a "blood clot in lung".  Informed patient we would be glad to see him and evaluate complaints, but if he specifically wanted to know if he has a blood clot in his lung, he needed to go to the ED.  Bess Harvest, [place] aware of patient complaint and discussion and patient decision.    Patient speaking in complete sentences, skin dry.

## 2019-11-24 NOTE — ED Provider Notes (Signed)
Signout from [place] Pa-C at shift change.  Patient with chest discomfort, concerned that he has a blood clot in his lungs.  Pending chest x-ray and blood work.  I followed up on the patient's labs.  They are reassuring.  D-dimer is normal.  Chest x-ray is clear.  When I went into the room the patient raised concerned over pain in his right forearm.  This is associated with swelling of the forearm muscle.  He admits to injecting IV drugs into this area.  Given this I ordered an x-ray to evaluate the soft tissues of the forearm.  I also used bedside ultrasound to evaluate for any fluid collections.  It appears that the patient has some inflammation of the muscle, I do not see any apparent abscess.  He has 2+ distal pulses and no signs of compartment syndrome or impending compartment syndrome.  Patient has worse pain when he flexes and extends his wrist and engages the muscles of the forearm.  No overlying erythema or warmth.  Patient has a very bizarre affect but is otherwise appropriate.  EMERGENCY DEPARTMENT US SOFT TISSUE INTERPRETATION "Study: Limited Soft Tissue Ultrasound"  INDICATIONS: Pain and Soft tissue infection Multiple views of the body part were obtained in real-time with a multi-frequency linear probe  PERFORMED BY: Myself IMAGES ARCHIVED?: Yes SIDE:Right  BODY PART:Upper extremity INTERPRETATION:  No abcess noted and No cellulitis noted   X-ray demonstrates 5 retained needles in the arm.  Obviously these are not all new.  Patient informed of results.  Given follow-up with orthopedist.  Discussed signs and symptoms to return including worsening pain, swelling, numbness or tingling of the hand, fever.  Patient given a dose of Bactrim and discharged home with a prescription for Bactrim and naproxen.  **I did receive a call while patient was in x-ray stating that patient threw himself off of a stool and onto the floor.  He got himself up immediately.  He did not hit his head.  They  are performing safety portal zone.  Upon return to the room I did evaluate the patient.  He was complaining of lower back pain.  He is bending over his waist without any difficulty in walking around the room without any difficulty.  No external signs of trauma on exam.  No focal tenderness or bony mid-line tenderness.  Patient stated that they threw him off of the stool while in x-ray.  Counseled patient on rest, NSAIDs as prescribed, ice or heat if he has any discomfort.  He is in no distress.Carlisle Cater, PA-C 11/24/19 2323    Lacretia Leigh, MD 11/25/19 [phone]

## 2019-11-24 NOTE — ED Notes (Signed)
Pt returned from XRAY 

## 2019-11-24 NOTE — Discharge Instructions (Addendum)
Your blood counts and blood clot test are normal.  Your chest x-ray is clear.  You have signs of retained needles in your forearm.  This is likely contributing to swelling.  You need to follow-up with an orthopedic doctor to have this addressed.  Return to the emergency department with worsening pain, swelling, fever, numbness or tingling in your hand.  Take antibiotics as prescribed.

## 2019-11-24 NOTE — ED Notes (Signed)
ED Provider at bedside for [place]

## 2019-11-24 NOTE — ED Notes (Signed)
Patient verbalizes understanding of discharge instructions. Opportunity for questioning and answers were provided. Armband removed by staff, pt discharged from ED ambulatory to home.  

## 2019-11-24 NOTE — ED Notes (Signed)
ED Provider at bedside. 

## 2019-11-24 NOTE — ED Notes (Signed)
PT transported to XRAY 
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
## 2020-01-01 ENCOUNTER — Emergency Department (HOSPITAL_COMMUNITY)
Admission: EM | Admit: 2020-01-01 | Discharge: 2020-01-01 | Disposition: A | Discharge status: Home / Self Care | Payer: Medicaid Other | Attending: Emergency Medicine | Admitting: Emergency Medicine

## 2020-01-01 ENCOUNTER — Emergency Department (HOSPITAL_COMMUNITY): Payer: Medicaid Other

## 2020-01-01 ENCOUNTER — Other Ambulatory Visit: Payer: Self-pay

## 2020-01-01 ENCOUNTER — Encounter (HOSPITAL_COMMUNITY): Payer: Self-pay | Admitting: Emergency Medicine

## 2020-01-01 DIAGNOSIS — W19XXXA Unspecified fall, initial encounter: Secondary | ICD-10-CM

## 2020-01-01 DIAGNOSIS — M25562 Pain in left knee: Secondary | ICD-10-CM | POA: Diagnosis not present

## 2020-01-01 DIAGNOSIS — F17290 Nicotine dependence, other tobacco product, uncomplicated: Secondary | ICD-10-CM | POA: Insufficient documentation

## 2020-01-01 DIAGNOSIS — Z79899 Other long term (current) drug therapy: Secondary | ICD-10-CM | POA: Insufficient documentation

## 2020-01-01 DIAGNOSIS — M545 Low back pain: Secondary | ICD-10-CM | POA: Insufficient documentation

## 2020-01-01 DIAGNOSIS — F99 Mental disorder, not otherwise specified: Secondary | ICD-10-CM | POA: Diagnosis not present

## 2020-01-01 DIAGNOSIS — Z202 Contact with and (suspected) exposure to infections with a predominantly sexual mode of transmission: Secondary | ICD-10-CM | POA: Insufficient documentation

## 2020-01-01 LAB — HIV ANTIBODY (ROUTINE TESTING W REFLEX): HIV Screen 4th Generation wRfx: NONREACTIVE

## 2020-01-01 LAB — URINALYSIS, ROUTINE W REFLEX MICROSCOPIC
Bacteria, UA: NONE SEEN
Bilirubin Urine: NEGATIVE
Glucose, UA: NEGATIVE mg/dL
Hgb urine dipstick: NEGATIVE
Ketones, ur: NEGATIVE mg/dL
Nitrite: NEGATIVE
Protein, ur: NEGATIVE mg/dL
Specific Gravity, Urine: 1.006 (ref 1.005–1.030)
pH: 6 (ref 5.0–8.0)

## 2020-01-01 LAB — RPR: RPR Ser Ql: NONREACTIVE

## 2020-01-01 MED ORDER — DOXYCYCLINE HYCLATE 100 MG PO CAPS: 100.0000 mg | ORAL_CAPSULE | Freq: Two times a day (BID) | ORAL | 0 refills | Status: AC

## 2020-01-01 MED ORDER — CEFTRIAXONE SODIUM 500 MG IJ SOLR
500.0000 mg | Freq: Once | INTRAMUSCULAR | Status: AC
  Administered 2020-01-01: 500 mg via INTRAMUSCULAR
  Filled 2020-01-01: qty 500

## 2020-01-01 MED ORDER — STERILE WATER FOR INJECTION IJ SOLN
INTRAMUSCULAR | Status: AC
  Administered 2020-01-01: 05:00:00 10 mL
  Filled 2020-01-01: qty 10

## 2020-01-01 NOTE — Discharge Instructions (Signed)
Thank you for allowing me to care for you today in the Emergency Department.   You were seen today for a fall.  Your x-rays were negative.  You also requested STD screening.  Your urine was sent for culture.  We also have a pending HIV and syphilis test.  If any of your test for STDs come back as positive, you will receive a call from the hospital.  If any of your tests are positive, it is your responsibility to let all other sexual partners know that you tested positive.  You were prophylactically treated today for chlamydia in the ER.  To prophylactically treat for gonorrhea, take 1 tablet of doxycycline 2 times daily for the next week for gonorrhea.  It is important to complete the entire course of medication to fully treat this infection.  The CDC recommends that you abstain from all sexual activity for 2 weeks after treatment.  If you continue to have sex and your test is positive, you could end up getting reinfected again.  You should also make sure to clean all sex toys or assistive devices.  Take 650 mg of Tylenol or 600 mg of ibuprofen with food every 6 hours for pain.  You can alternate between these 2 medications every 3 hours if your pain returns.  For instance, you can take Tylenol at noon, followed by a dose of ibuprofen at 3, followed by second dose of Tylenol and 6.  Stop using drugs.   Return to the emergency department if you develop high fevers, severe abdominal pain, fever, unable to walk, or other new, concerning symptoms.

## 2020-01-01 NOTE — ED Notes (Signed)
Pt to and from xray via cart. No distress noted.
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
## 2020-01-01 NOTE — ED Notes (Signed)
Pt came to the ED for a STD check. Denies any symptoms. Conscious, breathing, and A&Ox4. No distress noted. Chest rise and fall equally with non-labored breathing. No complaints.

## 2020-01-01 NOTE — ED Triage Notes (Signed)
Pt requesting to be tested and treated for an STD. Denies symptoms.

## 2020-01-03 ENCOUNTER — Encounter: Payer: Self-pay | Admitting: Nurse Practitioner

## 2020-01-03 ENCOUNTER — Other Ambulatory Visit: Payer: Self-pay

## 2020-01-03 ENCOUNTER — Ambulatory Visit (INDEPENDENT_AMBULATORY_CARE_PROVIDER_SITE_OTHER): Payer: Medicaid Other | Admitting: Nurse Practitioner

## 2020-01-03 VITALS — BP 136/83 | HR 86 | Temp 97.9°F | Resp 20 | Ht 76.0 in | Wt 221.0 lb

## 2020-01-03 DIAGNOSIS — F250 Schizoaffective disorder, bipolar type: Secondary | ICD-10-CM

## 2020-01-03 MED ORDER — QUETIAPINE FUMARATE ER 150 MG PO TB24: 150.0000 mg | ORAL_TABLET | Freq: Every day | ORAL | 0 refills | Status: DC

## 2020-01-03 NOTE — Patient Instructions (Signed)
Supporting Someone With Schizoaffective Disorder If you are a friend or family member of someone diagnosed with schizoaffective disorder (ScAD), you can play a helpful role in that person's life. Understanding as much as you can about ScAD will help you to be as supportive as possible. What do I need to know about this condition? ScAD is a lifelong mental illness that causes periodic episodes of mood swings and severe loss of contact with reality (psychosis). It may interfere with personal relationships or normal daily activities. A person with this illness has symptoms of a psychotic disorder (schizophrenia) and a mood (affective) disorder. There are two types based on the mood disorder that is present:  Bipolar ScAD. The person goes through episodes of emotional highs (mania) and emotional lows (depression).  Major depressive ScAD. The person only has episodes of depression. Symptoms of schizophrenia include:  Hearing, seeing, or feeling things that are not there (hallucinations).  Having fixed, false beliefs (delusions). These often involve a person believing that he or she is being attacked, harassed, cheated, persecuted, or plotted against.  Speech or behavior that is odd or disorganized or does not make sense to others (incoherent). Symptoms of mood disorders include:  Symptoms of mania, such as increased energy and activity, restlessness, and reckless behavior.  Symptoms of major depression, such as low energy and activity, feeling hopeless or worthless, and loss of interest in activities that used to be enjoyed. What do I need to know about the treatment options? ScAD requires long-term treatment. Treatment may include:  Medicines, such as antipsychotics, mood stabilizers, or antidepressants. The exact combination of medicines depends on the type and severity of the person's symptoms.  Counseling or talk therapy. Individual, group, or family counseling may be helpful in providing  education, support, and guidance. Many people with ScAD also benefit from social skills and job skills (vocational) training.  Electroconvulsive therapy (ECT). This procedure involves applying short electrical pulses to the brain through the scalp to change brain chemicals (neurotransmitters). ECT may be used to treat people with severe symptoms if other treatments are not effective. A combination of medicine and counseling is usually best for managing this disorder over time. How can I support my loved one? Talk about the condition Good communication can be helpful in supporting your friend or family member. Here are a few things to keep in mind:  Be careful about too much prodding. Try not to overdo reminders to an adult friend or family member about things like taking medicines. Ask how your loved one prefers that you help.  Be patient, listen well, and speak encouraging words.  Be available if your loved one wants to talk. Make an effort to acknowledge his or her feelings. Find support and resources A health care provider may be able to recommend mental health resources that are available online or over the phone. You could start with:  Substance Abuse and Mental Health Services Administration [facility](SAMHSA): SkateOasis.com.ptwww.samhsa.gov  National Alliance on Mental Illness (NAMI): www.nami.org Think about joining self-help and support groups, not only for your friend or family member, but also for yourself. People in these peer and family support groups understand what you and your loved one are going through. They can help you feel a sense of hope and connect you with local resources to help you learn more. You could also try family or couples therapy. General support  Make an effort to learn all you can about ScAD.  Ask your loved one how you can best help  him or her. Offer to help with daily responsibilities, such as laundry or meals.  Help your loved one follow his or her treatment plan as directed by  health care providers. This could mean driving him or her to therapy sessions or suggesting ways to cope with stress, such as going for a walk together.  Do not argue with your loved one about his or her perception of reality. How can I create a safe environment?  Create a written crisis plan. Include important phone numbers, such as the local crisis intervention team. Make sure that: ? The person with ScAD knows about this plan. ? Everyone who has regular contact with that person knows about the plan and knows what to do in an emergency.  Be aware that sometimes there is a risk of violence toward you when a person has schizoaffective disorder. Set limits when it comes to physical or verbal abuse. One thing you could do is tell your loved one that if he or she is getting violent, you will leave and then call the police. Talk ahead of time about your plans for handling violent situations. Doing that will help to make violent episodes easier to manage if they happen.  To lower the risk of violence or suicide during a crisis, remove or lock up guns and other weapons. If you do not have a safe place to keep a gun, local law enforcement may store a gun for you. How should I care for myself? Supporting someone with ScAD can cause stress. Be sure to find ways to care for your body, mind, and well-being.  Find someone you can talk to who will also help you develop coping skills to manage stress.  Try different techniques for coping with stress, such as: ? Muscle relaxation, meditation, and breathing exercises. ? Exercise, even if it is just taking a short walk a few times a week. ? Getting the right amount of good-quality sleep.  Try to maintain your normal routines. This can help you remember that your life is about more than your loved one's condition.  Make time for activities that help you relax, and try to not feel guilty about taking time for yourself. What are some signs that the condition is  getting worse? Warning signs that your loved one's condition is getting worse include:  Clear, sudden change in behavior or symptoms.  Struggling with daily activities.  Increased use of drugs or alcohol.  Not being able to fulfill basic needs (food, clothing, shelter). Warning signs that your loved one is thinking about suicide include:  Withdrawing from friends and family.  Talking about suicide or searching for methods.  Expressing feelings of being trapped or not having a purpose in life. Ask a counselor or your loved one's health care provider about when to get help if you are concerned about behavior changes. Privacy laws limit how much a health care provider can share with you without your loved one's permission, but if you feel that a situation is an emergency, do not wait to call a health care provider or emergency services. Get help right away if:  You are in a situation that threatens your life. Leave the situation and call emergency services (911 in the [place] as soon as possible. If you ever feel like your loved one may hurt himself or herself or others, or may have thoughts about taking his or her own life, get help right away. You can go to your nearest emergency department or call:  Your local emergency services (911 in the [place]  A suicide crisis helpline, such as the National Suicide Prevention Lifeline at 646-734-6443. This is open 24 hours a day. Summary  ScAD is a lifelong illness that causes periodic episodes of mood swings and severe loss of contact with reality (psychosis).  Treatment for ScAD is ongoing, and it may include medicines, counseling, therapy, support groups, and social skills or job Optician, dispensing.  Support that you give to your loved one is an important part of a successful treatment plan.  Make sure to take care of yourself while supporting someone with ScAD. Get help when you need it.  Expect mood swings and behavior episodes, and have a  plan for how to help your loved one manage them. If you feel threatened or are worried about your loved one's safety, contact your local emergency services as soon as possible. This information is not intended to replace advice given to you by your health care provider. Make sure you discuss any questions you have with your health care provider. Document Revised: 03/15/2019 Document Reviewed: 04/05/2017 Elsevier Patient Education  2020 [employer] Inc[employer] Schizoaffective Disorder Schizoaffective disorder (ScAD) is a mental illness. It causes symptoms that are a mixture of a psychotic disorder (schizophrenia) and a mood (affective) disorder. A psychotic disorder involves losing touch with reality. ScAD usually occurs in cycles. Periods of severe symptoms may be followed by periods of less severe symptoms or improvement. The illness affects men and women equally, but it usually appears at an earlier age (teenage or early adult years) in men. People who have family members with schizophrenia, bipolar disorder, or ScAD are at higher risk of developing ScAD. ScAD may interfere with personal relationships or normal daily activities. People with ScAD are at a higher risk for:  Job loss.  Social aloneness (isolation).  Health problems.  Anxiety.  Substance use disorders.  Suicide. What are the causes? The exact cause of this condition is not known. What increases the risk? The following factors may make you more likely to develop this condition:  Problems during your mother's pregnancy and after your birth, such as: ? Your mother having the flu (influenza) during the second semester of the pregnancy. ? Exposure to drugs, alcohol, illnesses, or other poisons (toxins) before birth. ? Low birth weight.  A brain infection or viral infection.  Problems with brain structure or function.  Having family members with bipolar disorder, ScAD, or schizophrenia.  Substance abuse.  Having been diagnosed  with a mental health condition in the past.  Being a victim of neglect or long-term (chronic) abuse. What are the signs or symptoms? At any one time, people with ScAD may have psychotic symptoms only or have both psychotic and affective symptoms. Psychotic symptoms may include:  Hearing, seeing, or feeling things that are not there (hallucinations).  Having fixed, false beliefs (delusions). The delusions usually include being attacked, harassed, or plotted against (paranoid delusions).  Speaking in a way that makes no sense to others (disorganized speech).  Confusing or odd behavior.  Loss of motivation for normal daily activities, such as self-care.  Withdrawal from social contacts (social isolation).  Lack of emotions. Affective symptoms may include:  Symptoms similar to major depression, such as: ? Depressed mood. ? Loss of interest in activities that are usually pleasurable (anhedonia). ? Sleeping more or less than normal. ? Feeling worthless or excessively guilty. ? Lack of energy or motivation. ? Trouble concentrating. ? Eating more or less than usual. ?  Thinking a lot about death or suicide.  Symptoms similar to bipolar mania. Bipolar mania refers to periods of severe elation, irritability, and high energy that are experienced by people who have bipolar disorder. These symptoms may include: ? Abnormally elevated or irritable mood. ? Abnormally increased energy or activity. ? More confidence than normal or feeling that you are able to do anything (grandiosity). ? Feeling rested after getting less sleep than normal. ? Being easily distracted. ? Talking more than usual or feeling pressure to keep talking. ? Feeling that your thoughts are racing. ? Engaging in high-risk activities. How is this diagnosed? ScAD is diagnosed through an assessment by your health care provider. Your health care provider may refer you to a mental health specialist for evaluation. The mental  health specialist:  Will observe and ask questions about your thoughts, behavior, mood, and ability to function in daily life.  May ask questions about your medical history and use of drugs, including prescription medicines. Certain medical conditions and substances can cause symptoms that resemble ScAD.  May do blood tests and imaging tests. There are two types of ScAD:  Depressive ScAD. This type is diagnosed when you have only depressive symptoms.  Bipolar ScAD. This type is diagnosed if your affective symptoms are only manic or are a mixture of manic and depressive. How is this treated? ScAD is usually a lifelong (chronic) illness that requires long-term treatment. Treatment may include:  Medicine. Different types of medicine are used to treat ScAD. The exact combination depends on the type and severity of your symptoms. ? Antipsychotic medicine may be used to control psychotic symptoms such as delusions, paranoia, and hallucinations. ? Mood stabilizers may be used to balance the highs and lows of bipolar manic mood swings. ? Antidepressant medicines may be used to treat depressive symptoms.  Counseling or talk therapy. Individual, group, or family counseling may be helpful in providing education, support, and guidance. Many people with ScAD also benefit from social skills and job skills (vocational) training. A combination of medicine and counseling is usually best for managing the disorder over time. A procedure in which electricity is applied to the brain through the scalp (electroconvulsive therapy) may be used to treat people with severe manic symptoms who do not respond to medicine and counseling. Follow these instructions at home:   Take over-the-counter and prescription medicines only as told by your health care provider. Check with your health care provider before starting new medicines.  Surround yourself with people who care about you and can help you manage your  condition.  Keep stress under control. Stress may make symptoms worse.  Avoid alcohol and drugs. They can affect how medicine works and make symptoms worse.  Keep all follow-up visits as told by your health care provider and counselor. This is important. Contact a health care provider if:  You are not able to take your medicines as prescribed.  Your symptoms get worse. Get help right away if:  You feel out of control.  You or others notice warning signs of suicide, such as: ? Increased use of drugs or alcohol ? Expressing feelings of not having a purpose in life or feeling trapped, guilty, anxious, agitated, or hopeless. ? Withdrawing from friends and family. ? Showing uncontrolled anger, recklessness, and dramatic mood changes. ? Talking about suicide or searching for methods. If you ever feel like you may hurt yourself or others, or have thoughts about taking your own life, get help right away. You can go  to your nearest emergency department or call:  Your local emergency services (911 in the [place]  A suicide crisis helpline, such as the National Suicide Prevention Lifeline at 947-470-7641. This is open 24 hours a day. Summary  Schizoaffective disorder causes symptoms that are a mixture of a psychotic disorder and a mood disorder.  A combination of medicine and counseling is usually best for managing the disorder over time.  People who have schizoaffective disorder are at risk for suicide. Get help right away if you or someone else notices warning signs of suicide. This information is not intended to replace advice given to you by your health care provider. Make sure you discuss any questions you have with your health care provider. Document Revised: 11/04/2017 Document Reviewed: 09/03/2016 Elsevier Patient Education  2020 [employer].
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
## 2020-01-17 ENCOUNTER — Ambulatory Visit: Payer: Self-pay | Admitting: Nurse Practitioner

## 2020-02-14 ENCOUNTER — Emergency Department (HOSPITAL_COMMUNITY)
Admission: EM | Admit: 2020-02-14 | Discharge: 2020-02-14 | Disposition: A | Discharge status: Home / Self Care | Payer: Medicaid Other | Attending: Emergency Medicine | Admitting: Emergency Medicine

## 2020-02-14 ENCOUNTER — Encounter (HOSPITAL_COMMUNITY): Payer: Self-pay

## 2020-02-14 ENCOUNTER — Emergency Department (HOSPITAL_COMMUNITY): Payer: Medicaid Other

## 2020-02-14 ENCOUNTER — Other Ambulatory Visit: Payer: Self-pay

## 2020-02-14 DIAGNOSIS — Z79899 Other long term (current) drug therapy: Secondary | ICD-10-CM | POA: Diagnosis not present

## 2020-02-14 DIAGNOSIS — M25572 Pain in left ankle and joints of left foot: Secondary | ICD-10-CM | POA: Insufficient documentation

## 2020-02-14 DIAGNOSIS — F10929 Alcohol use, unspecified with intoxication, unspecified: Secondary | ICD-10-CM | POA: Insufficient documentation

## 2020-02-14 DIAGNOSIS — F17290 Nicotine dependence, other tobacco product, uncomplicated: Secondary | ICD-10-CM | POA: Diagnosis not present

## 2020-02-14 DIAGNOSIS — Y9389 Activity, other specified: Secondary | ICD-10-CM | POA: Diagnosis not present

## 2020-02-14 DIAGNOSIS — X509XXA Other and unspecified overexertion or strenuous movements or postures, initial encounter: Secondary | ICD-10-CM | POA: Insufficient documentation

## 2020-02-14 DIAGNOSIS — Y9289 Other specified places as the place of occurrence of the external cause: Secondary | ICD-10-CM | POA: Insufficient documentation

## 2020-02-14 DIAGNOSIS — Y999 Unspecified external cause status: Secondary | ICD-10-CM | POA: Diagnosis not present

## 2020-02-14 LAB — CBC WITH DIFFERENTIAL/PLATELET
Abs Immature Granulocytes: 0 10*3/uL (ref 0.00–0.07)
Basophils Absolute: 0 10*3/uL (ref 0.0–0.1)
Basophils Relative: 0 %
Eosinophils Absolute: 0 10*3/uL (ref 0.0–0.5)
Eosinophils Relative: 1 %
HCT: 42.6 % (ref 39.0–52.0)
Hemoglobin: 14.2 g/dL (ref 13.0–17.0)
Immature Granulocytes: 0 %
Lymphocytes Relative: 37 %
Lymphs Abs: 1.3 10*3/uL (ref 0.7–4.0)
MCH: 28.6 pg (ref 26.0–34.0)
MCHC: 33.3 g/dL (ref 30.0–36.0)
MCV: 85.9 fL (ref 80.0–100.0)
Monocytes Absolute: 0.6 10*3/uL (ref 0.1–1.0)
Monocytes Relative: 15 %
Neutro Abs: 1.7 10*3/uL (ref 1.7–7.7)
Neutrophils Relative %: 47 %
Platelets: 159 10*3/uL (ref 150–400)
RBC: 4.96 MIL/uL (ref 4.22–5.81)
RDW: 13.8 % (ref 11.5–15.5)
WBC: 3.6 10*3/uL — ABNORMAL LOW (ref 4.0–10.5)
nRBC: 0 % (ref 0.0–0.2)

## 2020-02-14 LAB — BASIC METABOLIC PANEL
Anion gap: 9 (ref 5–15)
BUN: 17 mg/dL (ref 6–20)
CO2: 23 mmol/L (ref 22–32)
Calcium: 8.9 mg/dL (ref 8.9–10.3)
Chloride: 103 mmol/L (ref 98–111)
Creatinine, Ser: 1.17 mg/dL (ref 0.61–1.24)
GFR calc Af Amer: >60 mL/min (ref >60)
GFR calc non Af Amer: >60 mL/min (ref >60)
Glucose, Bld: 91 mg/dL (ref 70–99)
Potassium: 3.9 mmol/L (ref 3.5–5.1)
Sodium: 135 mmol/L (ref 135–145)

## 2020-02-14 MED ORDER — KETOROLAC TROMETHAMINE 60 MG/2ML IM SOLN
15.0000 mg | Freq: Once | INTRAMUSCULAR | Status: AC
  Administered 2020-02-14: 15 mg via INTRAMUSCULAR
  Filled 2020-02-14: qty 2

## 2020-02-14 MED ORDER — SODIUM CHLORIDE 0.9 % IV BOLUS
1000.0000 mL | Freq: Once | INTRAVENOUS | Status: AC
  Administered 2020-02-14: 1000 mL via INTRAVENOUS

## 2020-02-14 NOTE — Discharge Instructions (Addendum)
You have been seen today for fall. Please read and follow all provided instructions. Return to the emergency room for worsening condition or new concerning symptoms.    The x-ray of your ankle did not show any broken bones.  Recommend you wear the Ace wrap and elevate your leg every time you are sitting down to help with pain and swelling.  1. Medications:  Recommend you take Tylenol for pain.  Please take as directed on the bottle.  Continue usual home medications Take medications as prescribed. Please review all of the medicines and only take them if you do not have an allergy to them.   2. Treatment: rest, drink plenty of fluids.   3. Follow Up:  Please follow up with primary care provider by scheduling an appointment as soon as possible for a visit for symptom recheck  It is also a possibility that you have an allergic reaction to any of the medicines that you have been prescribed - Everybody reacts differently to medications and while MOST people have no trouble with most medicines, you may have a reaction such as nausea, vomiting, rash, swelling, shortness of breath. If this is the case, please stop taking the medicine immediately and contact your physician.  ?
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
## 2020-02-14 NOTE — ED Notes (Signed)
Unable to triage the patient due to being intoxicated. Patient only mumbles when asked a question.
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
## 2020-02-18 ENCOUNTER — Ambulatory Visit: Payer: Self-pay | Admitting: Family Medicine

## 2020-02-23 ENCOUNTER — Ambulatory Visit (HOSPITAL_COMMUNITY)
Admission: EM | Admit: 2020-02-23 | Discharge: 2020-02-23 | Disposition: A | Discharge status: Home / Self Care | Payer: Medicaid Other | Attending: Family Medicine | Admitting: Family Medicine

## 2020-02-23 ENCOUNTER — Other Ambulatory Visit: Payer: Self-pay

## 2020-02-23 ENCOUNTER — Ambulatory Visit (INDEPENDENT_AMBULATORY_CARE_PROVIDER_SITE_OTHER): Payer: Medicaid Other

## 2020-02-23 ENCOUNTER — Encounter (HOSPITAL_COMMUNITY): Payer: Self-pay | Admitting: Emergency Medicine

## 2020-02-23 DIAGNOSIS — M25572 Pain in left ankle and joints of left foot: Secondary | ICD-10-CM

## 2020-02-23 MED ORDER — IBUPROFEN 600 MG PO TABS: 600.0000 mg | ORAL_TABLET | Freq: Three times a day (TID) | ORAL | 0 refills | Status: DC | PRN

## 2020-02-23 NOTE — ED Triage Notes (Signed)
Pt sts left foot pain since fall 1 month ago worse in the heel area; pt sts lower back pain since fall as well

## 2020-02-23 NOTE — Discharge Instructions (Signed)
Your foot x-ray was normal. You can rest the foot Ibuprofen for pain This will also help with your back.  Follow up as needed for continued or worsening symptoms
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
## 2020-02-27 ENCOUNTER — Other Ambulatory Visit: Payer: Self-pay

## 2020-02-27 ENCOUNTER — Emergency Department (HOSPITAL_COMMUNITY)
Admission: EM | Admit: 2020-02-27 | Discharge: 2020-02-27 | Disposition: A | Discharge status: Home / Self Care | Payer: Medicaid Other | Attending: Emergency Medicine | Admitting: Emergency Medicine

## 2020-02-27 DIAGNOSIS — F1210 Cannabis abuse, uncomplicated: Secondary | ICD-10-CM | POA: Insufficient documentation

## 2020-02-27 DIAGNOSIS — Z79899 Other long term (current) drug therapy: Secondary | ICD-10-CM | POA: Insufficient documentation

## 2020-02-27 DIAGNOSIS — F17290 Nicotine dependence, other tobacco product, uncomplicated: Secondary | ICD-10-CM | POA: Diagnosis not present

## 2020-02-27 DIAGNOSIS — F1310 Sedative, hypnotic or anxiolytic abuse, uncomplicated: Secondary | ICD-10-CM | POA: Diagnosis not present

## 2020-02-27 DIAGNOSIS — M79672 Pain in left foot: Secondary | ICD-10-CM | POA: Insufficient documentation

## 2020-02-27 DIAGNOSIS — F1410 Cocaine abuse, uncomplicated: Secondary | ICD-10-CM | POA: Insufficient documentation

## 2020-02-27 MED ORDER — IBUPROFEN 800 MG PO TABS: 800.0000 mg | ORAL_TABLET | Freq: Three times a day (TID) | ORAL | 0 refills | Status: DC

## 2020-02-27 MED ORDER — IBUPROFEN 800 MG PO TABS
800.0000 mg | ORAL_TABLET | Freq: Once | ORAL | Status: AC
  Administered 2020-02-27: 800 mg via ORAL
  Filled 2020-02-27: qty 1

## 2020-02-27 NOTE — Discharge Instructions (Signed)
As discussed, follow-up with your PCP for further evaluation of left foot pain.  It does not appear infected.  Your previous x-ray is negative for any broken bones.  I am sending you home with some ibuprofen as needed for pain and inflammation.  Return to the ER for new or worsening symptoms.
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
## 2020-02-27 NOTE — ED Notes (Signed)
Pt d/c home per MD order. Discharge summary reviewed with pt. Pt verbalizes understanding.

## 2020-02-27 NOTE — ED Triage Notes (Signed)
Pt to ED requesting MRI of left foot, currently foot is in a boot and being followed by his PCP. C/o pain 8/10

## 2020-03-06 ENCOUNTER — Encounter: Payer: Self-pay | Admitting: Nurse Practitioner

## 2020-03-06 ENCOUNTER — Other Ambulatory Visit: Payer: Self-pay

## 2020-03-06 ENCOUNTER — Ambulatory Visit: Payer: Self-pay | Admitting: Nurse Practitioner

## 2020-03-06 ENCOUNTER — Ambulatory Visit (INDEPENDENT_AMBULATORY_CARE_PROVIDER_SITE_OTHER): Payer: Medicaid Other | Admitting: Nurse Practitioner

## 2020-03-06 VITALS — BP 109/74 | HR 79 | Temp 98.5°F | Resp 16 | Ht 76.0 in | Wt 239.0 lb

## 2020-03-06 DIAGNOSIS — F333 Major depressive disorder, recurrent, severe with psychotic symptoms: Secondary | ICD-10-CM | POA: Diagnosis not present

## 2020-03-06 DIAGNOSIS — M79672 Pain in left foot: Secondary | ICD-10-CM

## 2020-03-06 MED ORDER — QUETIAPINE FUMARATE ER 300 MG PO TB24: 300.0000 mg | ORAL_TABLET | Freq: Every day | ORAL | 0 refills | Status: DC

## 2020-03-06 NOTE — Patient Instructions (Signed)
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
## 2020-03-18 ENCOUNTER — Encounter (HOSPITAL_COMMUNITY): Payer: Self-pay

## 2020-03-18 ENCOUNTER — Other Ambulatory Visit: Payer: Self-pay

## 2020-03-18 ENCOUNTER — Ambulatory Visit (HOSPITAL_COMMUNITY)
Admission: EM | Admit: 2020-03-18 | Discharge: 2020-03-18 | Disposition: A | Discharge status: Home / Self Care | Payer: Medicaid Other | Attending: Family Medicine | Admitting: Family Medicine

## 2020-03-18 DIAGNOSIS — S51812A Laceration without foreign body of left forearm, initial encounter: Secondary | ICD-10-CM

## 2020-03-18 MED ORDER — TETANUS-DIPHTH-ACELL PERTUSSIS 5-2.5-18.5 LF-MCG/0.5 IM SUSP
INTRAMUSCULAR | Status: AC
  Filled 2020-03-18: qty 0.5

## 2020-03-18 MED ORDER — TETANUS-DIPHTH-ACELL PERTUSSIS 5-2.5-18.5 LF-MCG/0.5 IM SUSP
0.5000 mL | Freq: Once | INTRAMUSCULAR | Status: AC
  Administered 2020-03-18: 0.5 mL via INTRAMUSCULAR

## 2020-03-18 NOTE — ED Triage Notes (Signed)
Pt states he was cutting off a zip tie and he cut his right wrist. Pt states he was seen by the EMS today. Pt states he was at work when this happened.

## 2020-03-18 NOTE — Discharge Instructions (Addendum)
Please keep the area clean with soap and water  Please follow up in 7 days
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
## 2020-03-25 ENCOUNTER — Emergency Department (HOSPITAL_COMMUNITY)
Admission: EM | Admit: 2020-03-25 | Discharge: 2020-03-25 | Disposition: A | Discharge status: Home / Self Care | Payer: Medicaid Other | Attending: Emergency Medicine | Admitting: Emergency Medicine

## 2020-03-25 ENCOUNTER — Encounter (HOSPITAL_COMMUNITY): Payer: Self-pay | Admitting: Emergency Medicine

## 2020-03-25 DIAGNOSIS — R21 Rash and other nonspecific skin eruption: Secondary | ICD-10-CM | POA: Diagnosis present

## 2020-03-25 DIAGNOSIS — Z5321 Procedure and treatment not carried out due to patient leaving prior to being seen by health care provider: Secondary | ICD-10-CM | POA: Diagnosis not present

## 2020-03-25 NOTE — ED Notes (Signed)
Pt seen walking out of department through EMS bay. Mia PA also visualized patient leaving.
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
## 2020-03-25 NOTE — ED Triage Notes (Signed)
Pt arriving POV with rash all over. Pt reports the rash began 3 days ago.

## 2020-03-29 ENCOUNTER — Emergency Department (HOSPITAL_COMMUNITY)
Admission: EM | Admit: 2020-03-29 | Discharge: 2020-03-30 | Discharge status: Against Medical Advice | Payer: Medicaid Other | Attending: Emergency Medicine | Admitting: Emergency Medicine

## 2020-03-29 ENCOUNTER — Emergency Department (HOSPITAL_COMMUNITY): Payer: Medicaid Other

## 2020-03-29 ENCOUNTER — Encounter (HOSPITAL_COMMUNITY): Payer: Self-pay

## 2020-03-29 ENCOUNTER — Other Ambulatory Visit: Payer: Self-pay

## 2020-03-29 ENCOUNTER — Emergency Department (HOSPITAL_COMMUNITY)
Admission: EM | Admit: 2020-03-29 | Discharge: 2020-03-29 | Discharge status: Against Medical Advice | Payer: Medicaid Other

## 2020-03-29 DIAGNOSIS — Z79899 Other long term (current) drug therapy: Secondary | ICD-10-CM | POA: Insufficient documentation

## 2020-03-29 DIAGNOSIS — F17290 Nicotine dependence, other tobacco product, uncomplicated: Secondary | ICD-10-CM | POA: Insufficient documentation

## 2020-03-29 DIAGNOSIS — M546 Pain in thoracic spine: Secondary | ICD-10-CM | POA: Diagnosis present
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
## 2020-03-29 NOTE — ED Triage Notes (Signed)
Upper back/ cervical pain off an on since January. Sts he fell back in January.

## 2020-03-30 LAB — CBC WITH DIFFERENTIAL/PLATELET
Abs Immature Granulocytes: 0.01 K/uL (ref 0.00–0.07)
Basophils Absolute: 0 K/uL (ref 0.0–0.1)
Basophils Relative: 1 %
Eosinophils Absolute: 0.3 K/uL (ref 0.0–0.5)
Eosinophils Relative: 8 %
HCT: 46.4 % (ref 39.0–52.0)
Hemoglobin: 15.2 g/dL (ref 13.0–17.0)
Immature Granulocytes: 0 %
Lymphocytes Relative: 47 %
Lymphs Abs: 2 K/uL (ref 0.7–4.0)
MCH: 28.2 pg (ref 26.0–34.0)
MCHC: 32.8 g/dL (ref 30.0–36.0)
MCV: 86.1 fL (ref 80.0–100.0)
Monocytes Absolute: 0.4 K/uL (ref 0.1–1.0)
Monocytes Relative: 10 %
Neutro Abs: 1.4 K/uL — ABNORMAL LOW (ref 1.7–7.7)
Neutrophils Relative %: 34 %
Platelets: 162 K/uL (ref 150–400)
RBC: 5.39 MIL/uL (ref 4.22–5.81)
RDW: 13.5 % (ref 11.5–15.5)
WBC: 4.1 K/uL (ref 4.0–10.5)
nRBC: 0 % (ref 0.0–0.2)

## 2020-03-30 LAB — COMPREHENSIVE METABOLIC PANEL WITH GFR
ALT: 36 U/L (ref 0–44)
AST: 30 U/L (ref 15–41)
Albumin: 4 g/dL (ref 3.5–5.0)
Alkaline Phosphatase: 42 U/L (ref 38–126)
Anion gap: 8 (ref 5–15)
BUN: 8 mg/dL (ref 6–20)
CO2: 30 mmol/L (ref 22–32)
Calcium: 9.2 mg/dL (ref 8.9–10.3)
Chloride: 100 mmol/L (ref 98–111)
Creatinine, Ser: 1.11 mg/dL (ref 0.61–1.24)
GFR calc Af Amer: >60 mL/min (ref >60)
GFR calc non Af Amer: >60 mL/min (ref >60)
Glucose, Bld: 98 mg/dL (ref 70–99)
Potassium: 4.5 mmol/L (ref 3.5–5.1)
Sodium: 138 mmol/L (ref 135–145)
Total Bilirubin: 0.4 mg/dL (ref 0.3–1.2)
Total Protein: 7.7 g/dL (ref 6.5–8.1)

## 2020-03-30 MED ORDER — METHOCARBAMOL 500 MG PO TABS
1000.0000 mg | ORAL_TABLET | Freq: Once | ORAL | Status: AC
  Administered 2020-03-30: 1000 mg via ORAL
  Filled 2020-03-30: qty 2

## 2020-03-30 MED ORDER — METHOCARBAMOL 500 MG PO TABS: 500.0000 mg | ORAL_TABLET | Freq: Two times a day (BID) | ORAL | 0 refills | Status: AC

## 2020-03-30 MED ORDER — DICLOFENAC SODIUM 1 % EX GEL: 2.0000 g | Freq: Four times a day (QID) | CUTANEOUS | 0 refills | Status: DC

## 2020-03-30 NOTE — Discharge Instructions (Addendum)
1. Medications: robaxin, Voltaren gel, usual home medications 2. Treatment: rest, drink plenty of fluids, gentle stretching as discussed, alternate ice and heat 3. Follow Up: Please followup with your primary doctor in 3 days for discussion of your diagnoses and further evaluation after today's visit; if you do not have a primary care doctor use the resource guide provided to find one;  Return to the ER for worsening back pain, difficulty walking, loss of bowel or bladder control or other concerning symptoms

## 2020-04-03 ENCOUNTER — Ambulatory Visit: Payer: Self-pay | Admitting: Nurse Practitioner

## 2020-05-06 DIAGNOSIS — R200 Anesthesia of skin: Secondary | ICD-10-CM

## 2020-05-06 DIAGNOSIS — R202 Paresthesia of skin: Secondary | ICD-10-CM

## 2020-05-06 HISTORY — DX: Anesthesia of skin: R20.2

## 2020-05-06 HISTORY — DX: Anesthesia of skin: R20.0

## 2020-05-12 ENCOUNTER — Encounter: Payer: Self-pay | Admitting: Nurse Practitioner

## 2020-05-12 ENCOUNTER — Ambulatory Visit (INDEPENDENT_AMBULATORY_CARE_PROVIDER_SITE_OTHER): Payer: Medicaid Other | Admitting: Nurse Practitioner

## 2020-05-12 ENCOUNTER — Other Ambulatory Visit: Payer: Self-pay

## 2020-05-12 VITALS — BP 123/65 | HR 71 | Temp 96.9°F | Ht 76.0 in | Wt 230.0 lb

## 2020-05-12 DIAGNOSIS — F1414 Cocaine abuse with cocaine-induced mood disorder: Secondary | ICD-10-CM

## 2020-05-12 DIAGNOSIS — G894 Chronic pain syndrome: Secondary | ICD-10-CM | POA: Diagnosis not present

## 2020-05-12 DIAGNOSIS — R7989 Other specified abnormal findings of blood chemistry: Secondary | ICD-10-CM

## 2020-05-12 DIAGNOSIS — F250 Schizoaffective disorder, bipolar type: Secondary | ICD-10-CM

## 2020-05-12 MED ORDER — QUETIAPINE FUMARATE ER 400 MG PO TB24: 400.0000 mg | ORAL_TABLET | Freq: Every day | ORAL | 0 refills | Status: DC
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
## 2020-05-13 LAB — TSH: TSH: 0.932 u[IU]/mL (ref 0.450–4.500)

## 2020-06-02 ENCOUNTER — Ambulatory Visit (INDEPENDENT_AMBULATORY_CARE_PROVIDER_SITE_OTHER): Payer: Medicaid Other | Admitting: Family Medicine

## 2020-06-02 ENCOUNTER — Encounter: Payer: Self-pay | Admitting: Family Medicine

## 2020-06-02 ENCOUNTER — Other Ambulatory Visit: Payer: Self-pay

## 2020-06-02 DIAGNOSIS — F250 Schizoaffective disorder, bipolar type: Secondary | ICD-10-CM | POA: Diagnosis not present

## 2020-06-02 DIAGNOSIS — R200 Anesthesia of skin: Secondary | ICD-10-CM | POA: Diagnosis not present

## 2020-06-02 DIAGNOSIS — R202 Paresthesia of skin: Secondary | ICD-10-CM

## 2020-06-02 DIAGNOSIS — M792 Neuralgia and neuritis, unspecified: Secondary | ICD-10-CM | POA: Diagnosis not present

## 2020-06-02 DIAGNOSIS — Z09 Encounter for follow-up examination after completed treatment for conditions other than malignant neoplasm: Secondary | ICD-10-CM

## 2020-06-02 DIAGNOSIS — G894 Chronic pain syndrome: Secondary | ICD-10-CM | POA: Diagnosis not present

## 2020-06-02 MED ORDER — GABAPENTIN 300 MG PO CAPS: 300.0000 mg | ORAL_CAPSULE | Freq: Three times a day (TID) | ORAL | 3 refills | Status: AC
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
## 2020-06-04 ENCOUNTER — Encounter: Payer: Self-pay | Admitting: Family Medicine

## 2020-06-04 DIAGNOSIS — R202 Paresthesia of skin: Secondary | ICD-10-CM | POA: Insufficient documentation

## 2020-06-04 DIAGNOSIS — F250 Schizoaffective disorder, bipolar type: Secondary | ICD-10-CM | POA: Insufficient documentation

## 2020-06-04 DIAGNOSIS — R200 Anesthesia of skin: Secondary | ICD-10-CM | POA: Insufficient documentation

## 2020-06-04 DIAGNOSIS — M792 Neuralgia and neuritis, unspecified: Secondary | ICD-10-CM | POA: Insufficient documentation

## 2020-07-03 ENCOUNTER — Ambulatory Visit: Payer: Medicaid Other | Admitting: Nurse Practitioner

## 2020-07-07 ENCOUNTER — Ambulatory Visit: Payer: Medicaid Other | Admitting: Nurse Practitioner

## 2020-08-13 ENCOUNTER — Ambulatory Visit: Payer: Medicaid Other | Admitting: Nurse Practitioner

## 2020-08-15 ENCOUNTER — Other Ambulatory Visit: Payer: Self-pay | Admitting: Nurse Practitioner

## 2020-08-17 IMAGING — CR LEFT RIBS AND CHEST - 3+ VIEW
6 series · 6 of 6 positions shown · non-contrast
Comparison: 07/17/2019

CLINICAL DATA: Left chest and tarsal pain

EXAM:
LEFT RIBS AND CHEST - 3+ VIEW

[t ribs ap upper left]
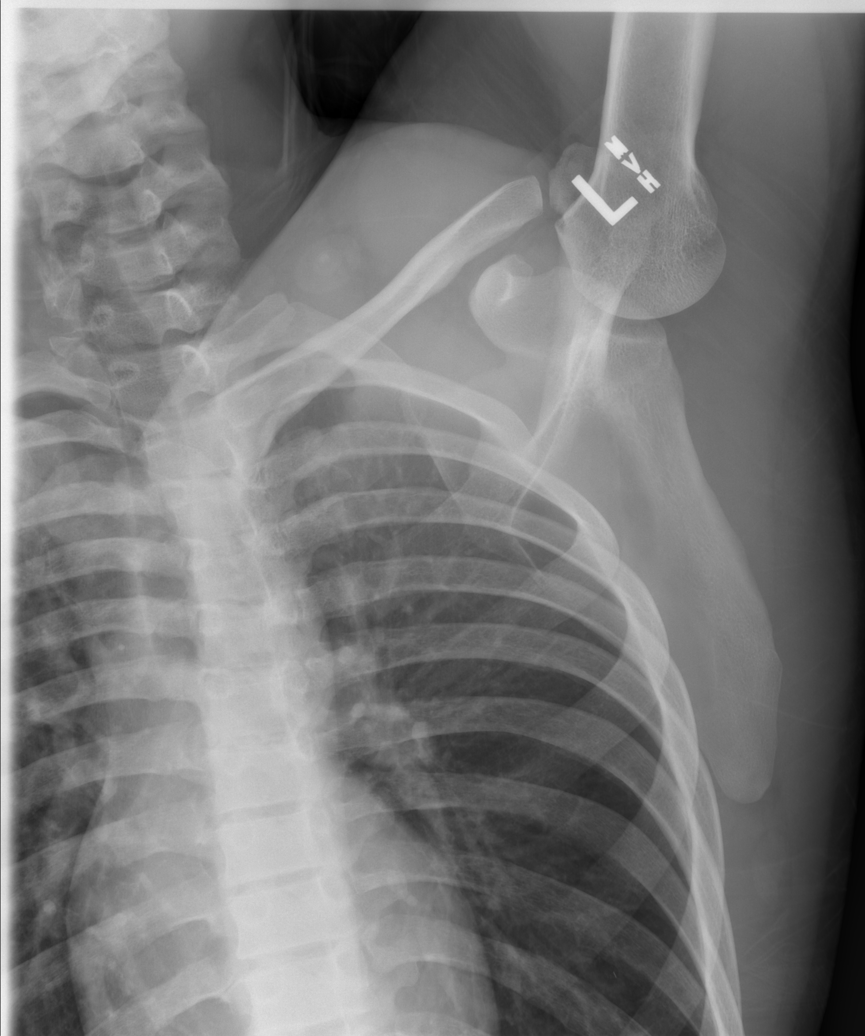

[t ribs ap lower left (1 of 3)]
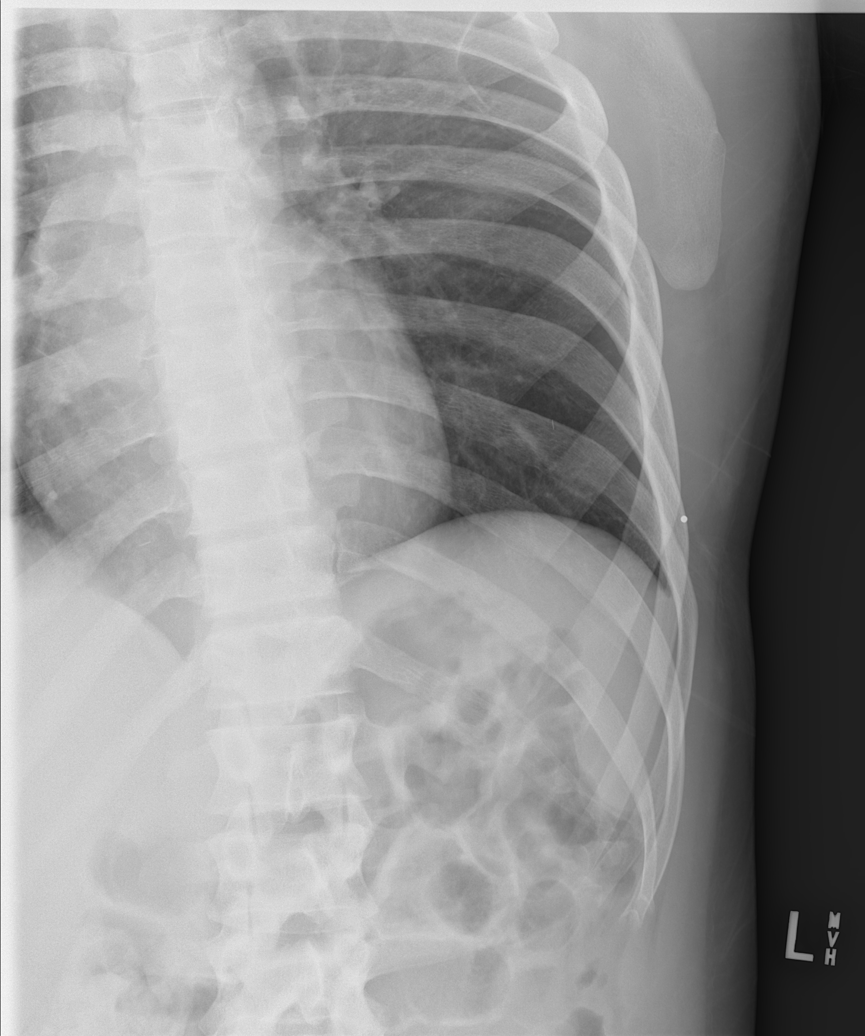

[t ribs ap lower left (2 of 3)]
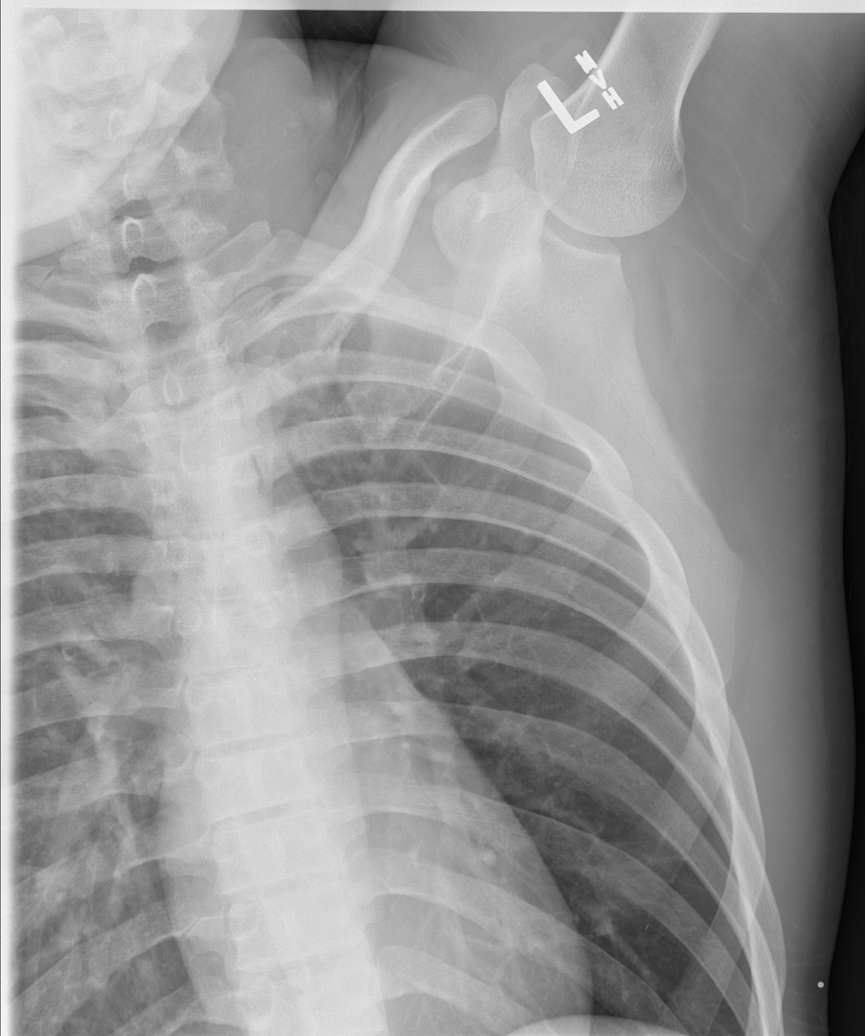

[t ribs ap lower left (3 of 3)]
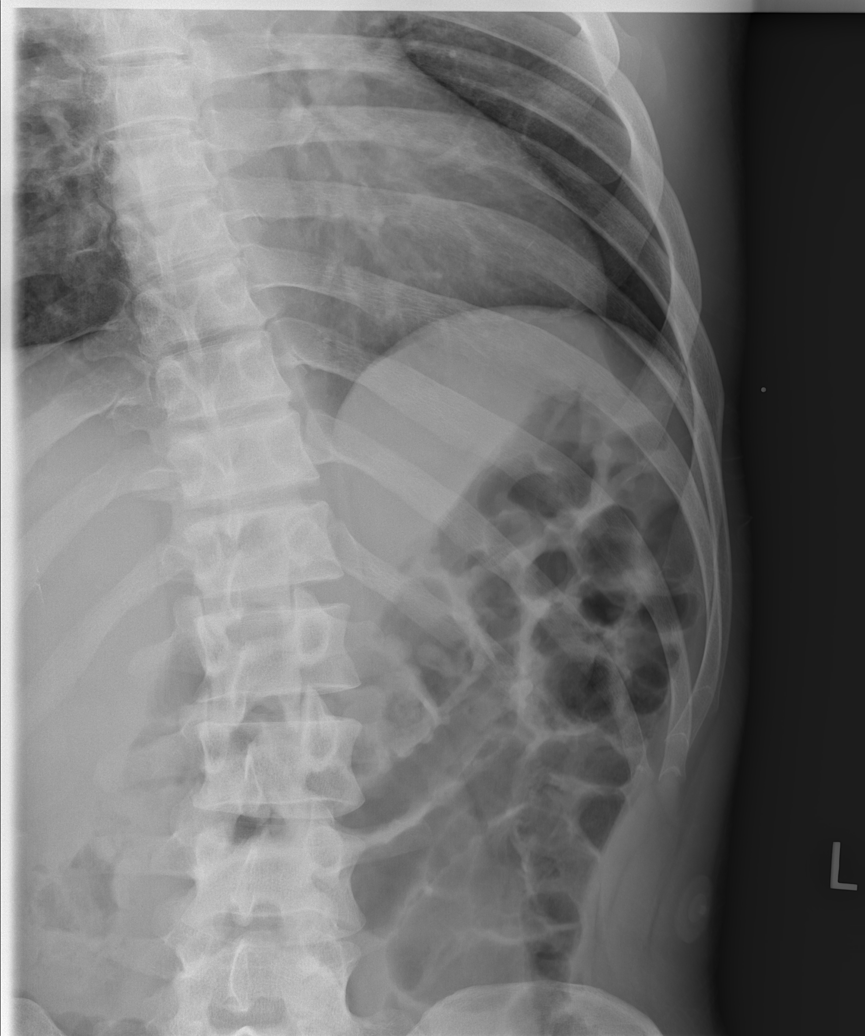

[t chest supine (1 of 2)]
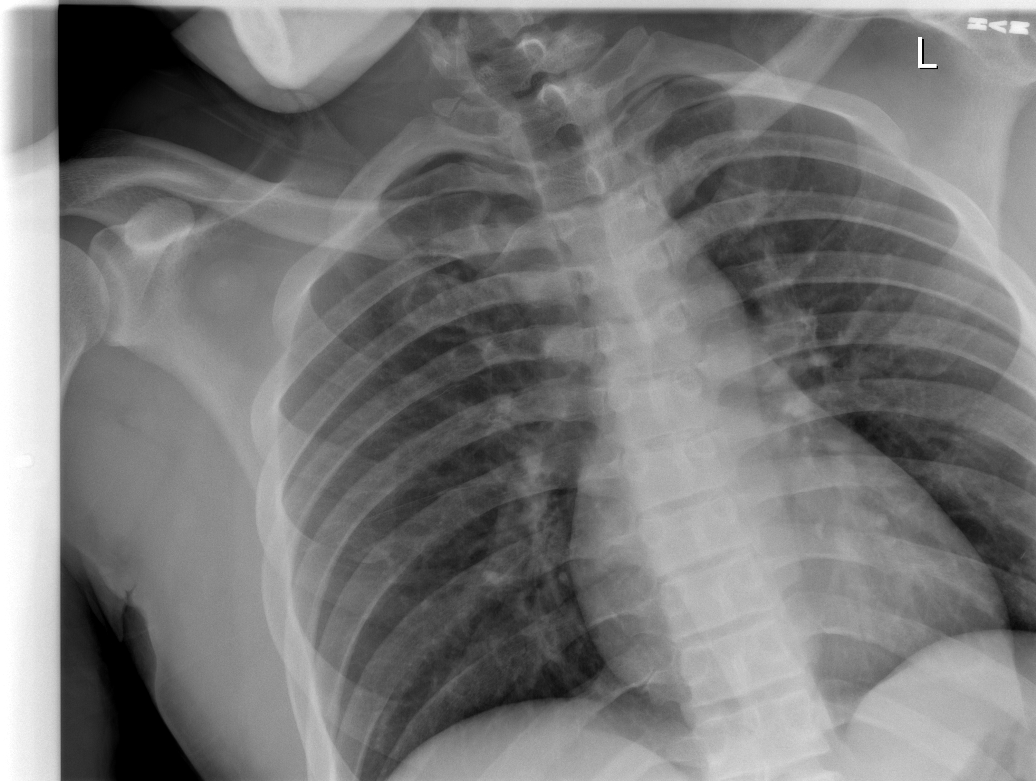

[t chest supine (2 of 2)]
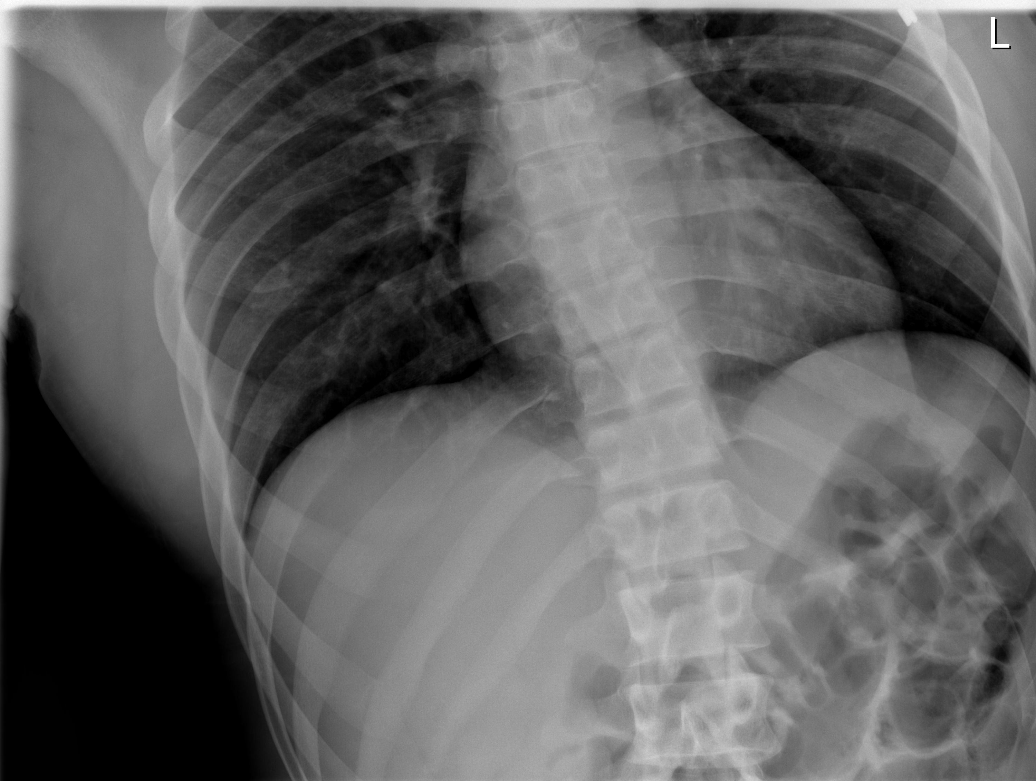

[6 of 6 positions shown; findings below may reference images not displayed]

FINDINGS: No fracture or other bone lesions are seen involving the ribs. There
is no evidence of pneumothorax or pleural effusion. Both lungs are
clear. Heart size and mediastinal contours are within normal limits.
IMPRESSION: Examination is limited by positioning and patient inability to
stand. No acute osseous abnormality is visualized.

## 2020-08-20 ENCOUNTER — Other Ambulatory Visit: Payer: Self-pay

## 2020-08-20 MED ORDER — QUETIAPINE FUMARATE ER 400 MG PO TB24: 400.0000 mg | ORAL_TABLET | Freq: Every day | ORAL | 0 refills | Status: AC

## 2020-09-13 ENCOUNTER — Emergency Department (HOSPITAL_COMMUNITY)
Admission: EM | Admit: 2020-09-13 | Discharge: 2020-09-13 | Disposition: A | Discharge status: Home / Self Care | Payer: Medicaid Other | Attending: Emergency Medicine | Admitting: Emergency Medicine

## 2020-09-13 ENCOUNTER — Other Ambulatory Visit: Payer: Self-pay

## 2020-09-13 ENCOUNTER — Emergency Department (HOSPITAL_COMMUNITY): Payer: Medicaid Other

## 2020-09-13 ENCOUNTER — Encounter (HOSPITAL_COMMUNITY): Payer: Self-pay | Admitting: *Deleted

## 2020-09-13 DIAGNOSIS — S299XXA Unspecified injury of thorax, initial encounter: Secondary | ICD-10-CM | POA: Insufficient documentation

## 2020-09-13 DIAGNOSIS — R4182 Altered mental status, unspecified: Secondary | ICD-10-CM | POA: Insufficient documentation

## 2020-09-13 DIAGNOSIS — S060X9A Concussion with loss of consciousness of unspecified duration, initial encounter: Secondary | ICD-10-CM | POA: Diagnosis not present

## 2020-09-13 DIAGNOSIS — S60511A Abrasion of right hand, initial encounter: Secondary | ICD-10-CM | POA: Insufficient documentation

## 2020-09-13 DIAGNOSIS — T07XXXA Unspecified multiple injuries, initial encounter: Secondary | ICD-10-CM

## 2020-09-13 DIAGNOSIS — S70211A Abrasion, right hip, initial encounter: Secondary | ICD-10-CM | POA: Diagnosis not present

## 2020-09-13 DIAGNOSIS — S298XXA Other specified injuries of thorax, initial encounter: Secondary | ICD-10-CM

## 2020-09-13 DIAGNOSIS — S01112A Laceration without foreign body of left eyelid and periocular area, initial encounter: Secondary | ICD-10-CM | POA: Diagnosis not present

## 2020-09-13 DIAGNOSIS — F17290 Nicotine dependence, other tobacco product, uncomplicated: Secondary | ICD-10-CM | POA: Insufficient documentation

## 2020-09-13 DIAGNOSIS — S161XXA Strain of muscle, fascia and tendon at neck level, initial encounter: Secondary | ICD-10-CM | POA: Diagnosis not present

## 2020-09-13 DIAGNOSIS — T1490XA Injury, unspecified, initial encounter: Secondary | ICD-10-CM

## 2020-09-13 DIAGNOSIS — S80211A Abrasion, right knee, initial encounter: Secondary | ICD-10-CM | POA: Diagnosis not present

## 2020-09-13 DIAGNOSIS — M549 Dorsalgia, unspecified: Secondary | ICD-10-CM | POA: Diagnosis not present

## 2020-09-13 DIAGNOSIS — R109 Unspecified abdominal pain: Secondary | ICD-10-CM | POA: Insufficient documentation

## 2020-09-13 DIAGNOSIS — E041 Nontoxic single thyroid nodule: Secondary | ICD-10-CM

## 2020-09-13 DIAGNOSIS — Y92410 Unspecified street and highway as the place of occurrence of the external cause: Secondary | ICD-10-CM | POA: Diagnosis not present

## 2020-09-13 DIAGNOSIS — S0181XA Laceration without foreign body of other part of head, initial encounter: Secondary | ICD-10-CM | POA: Diagnosis present

## 2020-09-13 DIAGNOSIS — S70212A Abrasion, left hip, initial encounter: Secondary | ICD-10-CM | POA: Insufficient documentation

## 2020-09-13 DIAGNOSIS — Y9389 Activity, other specified: Secondary | ICD-10-CM | POA: Diagnosis not present

## 2020-09-13 LAB — COMPREHENSIVE METABOLIC PANEL
ALT: 17 U/L (ref 0–44)
AST: 24 U/L (ref 15–41)
Albumin: 3.9 g/dL (ref 3.5–5.0)
Alkaline Phosphatase: 39 U/L (ref 38–126)
Anion gap: 12 (ref 5–15)
BUN: 10 mg/dL (ref 6–20)
CO2: 22 mmol/L (ref 22–32)
Calcium: 9.3 mg/dL (ref 8.9–10.3)
Chloride: 102 mmol/L (ref 98–111)
Creatinine, Ser: 1 mg/dL (ref 0.61–1.24)
GFR, Estimated: >60 mL/min (ref >60)
Glucose, Bld: 89 mg/dL (ref 70–99)
Potassium: 3.5 mmol/L (ref 3.5–5.1)
Sodium: 136 mmol/L (ref 135–145)
Total Bilirubin: 1 mg/dL (ref 0.3–1.2)
Total Protein: 6.9 g/dL (ref 6.5–8.1)

## 2020-09-13 LAB — CBC
HCT: 40.1 % (ref 39.0–52.0)
Hemoglobin: 13 g/dL (ref 13.0–17.0)
MCH: 27.9 pg (ref 26.0–34.0)
MCHC: 32.4 g/dL (ref 30.0–36.0)
MCV: 86.1 fL (ref 80.0–100.0)
Platelets: 196 10*3/uL (ref 150–400)
RBC: 4.66 MIL/uL (ref 4.22–5.81)
RDW: 14 % (ref 11.5–15.5)
WBC: 6.1 10*3/uL (ref 4.0–10.5)
nRBC: 0 % (ref 0.0–0.2)

## 2020-09-13 LAB — SAMPLE TO BLOOD BANK

## 2020-09-13 LAB — ETHANOL: Alcohol, Ethyl (B): <10 mg/dL (ref <10)

## 2020-09-13 MED ORDER — LIDOCAINE-EPINEPHRINE 1 %-1:100000 IJ SOLN
20.0000 mL | Freq: Once | INTRAMUSCULAR | Status: AC
  Administered 2020-09-13: 20 mL
  Filled 2020-09-13: qty 1

## 2020-09-13 MED ORDER — FENTANYL CITRATE (PF) 100 MCG/2ML IJ SOLN
100.0000 ug | Freq: Once | INTRAMUSCULAR | Status: AC
  Administered 2020-09-13: 100 ug via INTRAVENOUS
  Filled 2020-09-13 (×2): qty 2

## 2020-09-13 MED ORDER — TETANUS-DIPHTH-ACELL PERTUSSIS 5-2.5-18.5 LF-MCG/0.5 IM SUSP
0.5000 mL | Freq: Once | INTRAMUSCULAR | Status: DC
  Filled 2020-09-13: qty 0.5

## 2020-09-13 MED ORDER — FENTANYL CITRATE (PF) 100 MCG/2ML IJ SOLN
50.0000 ug | Freq: Once | INTRAMUSCULAR | Status: AC
  Administered 2020-09-13: 50 ug via INTRAVENOUS
  Filled 2020-09-13: qty 2

## 2020-09-13 MED ORDER — IOHEXOL 300 MG/ML  SOLN
100.0000 mL | Freq: Once | INTRAMUSCULAR | Status: AC | PRN
  Administered 2020-09-13: 100 mL via INTRAVENOUS
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
## 2020-09-13 NOTE — ED Notes (Signed)
Unable to complete vitals gpd came in immediately and started asking questions
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
## 2020-09-13 NOTE — ED Provider Notes (Signed)
LACERATION REPAIR Performed by: Arnoldo Hooker Authorized by: Arnoldo Hooker Consent: Verbal consent obtained. Risks and benefits: risks, benefits and alternatives were discussed Consent given by: patient Patient identity confirmed: provided demographic data Prepped and Draped in normal sterile fashion Wound explored  Laceration Location: left inferior eyebrow  Laceration Length: 4.5 cm  No Foreign Bodies seen or palpated  Anesthesia: local infiltration  Local anesthetic: lidocaine 2% w/epinephrine  Anesthetic total: 2 ml  Wound edges debrided for good approximation and closure  Irrigation method: syringe Amount of cleaning: standard  Skin closure:    SQ - 6-0 Vicryl   Outer - 5-0 fast absorbing  Number of sutures:    SQ - 3   Outer - 10  Technique: simple interrupted  Patient tolerance: Patient tolerated the procedure well with no immediate complications.    Elpidio Anis, PA-C 09/13/20 0703    Zadie Rhine, MD 09/13/20 [phone]

## 2021-02-01 ENCOUNTER — Emergency Department (HOSPITAL_COMMUNITY)
Admission: EM | Admit: 2021-02-01 | Discharge: 2021-02-01 | Disposition: A | Discharge status: Home / Self Care | Payer: Medicaid Other | Attending: Emergency Medicine | Admitting: Emergency Medicine

## 2021-02-01 ENCOUNTER — Encounter (HOSPITAL_COMMUNITY): Payer: Self-pay | Admitting: Emergency Medicine

## 2021-02-01 ENCOUNTER — Emergency Department (HOSPITAL_COMMUNITY): Payer: Medicaid Other

## 2021-02-01 ENCOUNTER — Other Ambulatory Visit: Payer: Self-pay

## 2021-02-01 DIAGNOSIS — F250 Schizoaffective disorder, bipolar type: Secondary | ICD-10-CM | POA: Diagnosis not present

## 2021-02-01 DIAGNOSIS — R7989 Other specified abnormal findings of blood chemistry: Secondary | ICD-10-CM

## 2021-02-01 DIAGNOSIS — M51379 Other intervertebral disc degeneration, lumbosacral region without mention of lumbar back pain or lower extremity pain: Secondary | ICD-10-CM

## 2021-02-01 DIAGNOSIS — E041 Nontoxic single thyroid nodule: Secondary | ICD-10-CM

## 2021-02-01 DIAGNOSIS — M5137 Other intervertebral disc degeneration, lumbosacral region: Secondary | ICD-10-CM | POA: Insufficient documentation

## 2021-02-01 DIAGNOSIS — F17210 Nicotine dependence, cigarettes, uncomplicated: Secondary | ICD-10-CM | POA: Insufficient documentation

## 2021-02-01 DIAGNOSIS — Z20822 Contact with and (suspected) exposure to covid-19: Secondary | ICD-10-CM | POA: Diagnosis not present

## 2021-02-01 DIAGNOSIS — J90 Pleural effusion, not elsewhere classified: Secondary | ICD-10-CM

## 2021-02-01 DIAGNOSIS — J189 Pneumonia, unspecified organism: Secondary | ICD-10-CM

## 2021-02-01 DIAGNOSIS — R0602 Shortness of breath: Secondary | ICD-10-CM | POA: Diagnosis present

## 2021-02-01 DIAGNOSIS — Z79899 Other long term (current) drug therapy: Secondary | ICD-10-CM | POA: Insufficient documentation

## 2021-02-01 LAB — COMPREHENSIVE METABOLIC PANEL
ALT: 36 U/L (ref 0–44)
AST: 29 U/L (ref 15–41)
Albumin: 3.9 g/dL (ref 3.5–5.0)
Alkaline Phosphatase: 43 U/L (ref 38–126)
Anion gap: 11 (ref 5–15)
BUN: 7 mg/dL (ref 6–20)
CO2: 24 mmol/L (ref 22–32)
Calcium: 9 mg/dL (ref 8.9–10.3)
Chloride: 101 mmol/L (ref 98–111)
Creatinine, Ser: 0.95 mg/dL (ref 0.61–1.24)
GFR, Estimated: >60 mL/min (ref >60)
Glucose, Bld: 100 mg/dL — ABNORMAL HIGH (ref 70–99)
Potassium: 3.7 mmol/L (ref 3.5–5.1)
Sodium: 136 mmol/L (ref 135–145)
Total Bilirubin: 0.8 mg/dL (ref 0.3–1.2)
Total Protein: 7.8 g/dL (ref 6.5–8.1)

## 2021-02-01 LAB — URINALYSIS, ROUTINE W REFLEX MICROSCOPIC
Bilirubin Urine: NEGATIVE
Glucose, UA: NEGATIVE mg/dL
Hgb urine dipstick: NEGATIVE
Ketones, ur: NEGATIVE mg/dL
Leukocytes,Ua: NEGATIVE
Nitrite: NEGATIVE
Protein, ur: NEGATIVE mg/dL
Specific Gravity, Urine: 1.001 — ABNORMAL LOW (ref 1.005–1.030)
pH: 7 (ref 5.0–8.0)

## 2021-02-01 LAB — CBC WITH DIFFERENTIAL/PLATELET
Abs Immature Granulocytes: 0.01 10*3/uL (ref 0.00–0.07)
Basophils Absolute: 0 10*3/uL (ref 0.0–0.1)
Basophils Relative: 0 %
Eosinophils Absolute: 0 10*3/uL (ref 0.0–0.5)
Eosinophils Relative: 1 %
HCT: 43.6 % (ref 39.0–52.0)
Hemoglobin: 14.7 g/dL (ref 13.0–17.0)
Immature Granulocytes: 0 %
Lymphocytes Relative: 31 %
Lymphs Abs: 1.4 10*3/uL (ref 0.7–4.0)
MCH: 28.1 pg (ref 26.0–34.0)
MCHC: 33.7 g/dL (ref 30.0–36.0)
MCV: 83.2 fL (ref 80.0–100.0)
Monocytes Absolute: 0.5 10*3/uL (ref 0.1–1.0)
Monocytes Relative: 11 %
Neutro Abs: 2.5 10*3/uL (ref 1.7–7.7)
Neutrophils Relative %: 57 %
Platelets: 140 10*3/uL — ABNORMAL LOW (ref 150–400)
RBC: 5.24 MIL/uL (ref 4.22–5.81)
RDW: 12.9 % (ref 11.5–15.5)
WBC: 4.4 10*3/uL (ref 4.0–10.5)
nRBC: 0 % (ref 0.0–0.2)

## 2021-02-01 LAB — C-REACTIVE PROTEIN: CRP: 0.6 mg/dL (ref <1.0)

## 2021-02-01 LAB — TRIGLYCERIDES: Triglycerides: 40 mg/dL (ref <150)

## 2021-02-01 LAB — RESP PANEL BY RT-PCR (FLU A&B, COVID) ARPGX2
Influenza A by PCR: NEGATIVE
Influenza B by PCR: NEGATIVE
SARS Coronavirus 2 by RT PCR: NEGATIVE

## 2021-02-01 LAB — D-DIMER, QUANTITATIVE: D-Dimer, Quant: 0.53 ug/mL-FEU — ABNORMAL HIGH (ref 0.00–0.50)

## 2021-02-01 LAB — LACTATE DEHYDROGENASE: LDH: 156 U/L (ref 98–192)

## 2021-02-01 LAB — FERRITIN: Ferritin: 99 ng/mL (ref 24–336)

## 2021-02-01 LAB — PROCALCITONIN: Procalcitonin: <0.1 ng/mL

## 2021-02-01 LAB — LACTIC ACID, PLASMA: Lactic Acid, Venous: 1 mmol/L (ref 0.5–1.9)

## 2021-02-01 LAB — FIBRINOGEN: Fibrinogen: 306 mg/dL (ref 210–475)

## 2021-02-01 MED ORDER — IOHEXOL 350 MG/ML SOLN
100.0000 mL | Freq: Once | INTRAVENOUS | Status: AC | PRN
  Administered 2021-02-01: 100 mL via INTRAVENOUS

## 2021-02-01 MED ORDER — CEFDINIR 300 MG PO CAPS: 300.0000 mg | ORAL_CAPSULE | Freq: Two times a day (BID) | ORAL | 0 refills | Status: DC

## 2021-02-01 MED ORDER — LORAZEPAM 2 MG/ML IJ SOLN
1.0000 mg | Freq: Once | INTRAMUSCULAR | Status: AC
  Administered 2021-02-01: 1 mg via INTRAVENOUS
  Filled 2021-02-01: qty 1

## 2021-02-01 MED ORDER — CEFDINIR 300 MG PO CAPS: 300.0000 mg | ORAL_CAPSULE | Freq: Two times a day (BID) | ORAL | 0 refills | Status: AC

## 2021-02-01 MED ORDER — ONDANSETRON HCL 4 MG/2ML IJ SOLN
4.0000 mg | Freq: Once | INTRAMUSCULAR | Status: AC
  Administered 2021-02-01: 4 mg via INTRAVENOUS
  Filled 2021-02-01: qty 2

## 2021-02-01 MED ORDER — AZITHROMYCIN 250 MG PO TABS: 250.0000 mg | ORAL_TABLET | Freq: Every day | ORAL | 0 refills | Status: AC

## 2021-02-01 MED ORDER — AZITHROMYCIN 250 MG PO TABS
500.0000 mg | ORAL_TABLET | Freq: Once | ORAL | Status: AC
  Administered 2021-02-01: 500 mg via ORAL
  Filled 2021-02-01: qty 2

## 2021-02-01 MED ORDER — CEFDINIR 300 MG PO CAPS
300.0000 mg | ORAL_CAPSULE | Freq: Two times a day (BID) | ORAL | Status: DC
  Administered 2021-02-01: 300 mg via ORAL
  Filled 2021-02-01: qty 1

## 2021-02-01 MED ORDER — SODIUM CHLORIDE 0.9 % IV SOLN: 1.0000 g | Freq: Once | INTRAVENOUS | Status: DC

## 2021-02-01 NOTE — Progress Notes (Signed)
Failed attempt at MRI. Patient was medicated prior to MRI; however, he was still unable to tolerate the procedure. Patient continues to move during the exam. Motion degraded MRI of the spine without gadolinium completed.

## 2021-02-01 NOTE — ED Triage Notes (Signed)
EMS reports from home, fever, body aches, SOB, since yesterday. Pt states he took Advil at 1430.  BP 146/92 HR 90 RR 16 Sp02 98 RA CBG 120 Temp 101.0

## 2021-02-01 NOTE — Discharge Instructions (Signed)
Tony Walsh,   The CT scan of your chest revealed that the likely cause of your symptoms is atypical pneumonia. Your COVID-19 and influenza testing was negative, although this does not 100% rule out COVID-19 infection as it is possible to have false negative test results.   To cover for possible bacterial pneumonia, I have prescribed azithromycin 1 tablet daily and cefdinir, 1 tablet in the morning and 1 tablet in the evening. Please take as prescribed until you have finished both prescriptions. You may continue to take Tylenol for pain/fever every 6 hours and be sure to drink plenty of fluids.  Your CT scan was also notable for a thyroid nodule. Typically, these nodules require further outpatient workup including blood tests to assess thyroid levels and thyroid ultrasound.   Please schedule a follow up appointment with your Primary Care Provider (Dr. Brooke Dare) within the next 2 weeks for hospital follow up. Please return to the ED if you experience persistent fevers/chills that do not respond with Tylenol, severe shortness of breath, or any other concerning symptoms.   Dr. Laddie Aquas

## 2021-02-01 NOTE — ED Triage Notes (Signed)
Patient here from home reporting fever, generalized body aches, and congestion that started yesterday. Not vaccinated.

## 2021-02-01 NOTE — ED Notes (Signed)
Patient transported to MRI 

## 2021-02-01 NOTE — ED Notes (Signed)
Patient unable to remain still for MRI. Ativan requested by Dr. Laddie Aquas. Ativan given in MRI.
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
## 2021-02-02 ENCOUNTER — Telehealth: Payer: Self-pay | Admitting: *Deleted

## 2021-02-02 NOTE — Telephone Encounter (Signed)
Transition Care Management Follow-up Telephone Call  Date of discharge and from where: 02/01/2021 - [facility]  How have you been since you were released from the hospital? "I am fine"  Any questions or concerns? No  Items Reviewed:  Did the pt receive and understand the discharge instructions provided? Yes   Medications obtained and verified? Yes   Other? No   Any new allergies since your discharge? No   Dietary orders reviewed? Yes  Do you have support at home? Yes   Home Care and Equipment/Supplies: Were home health services ordered? not applicable If so, what is the name of the agency? N/A  Has the agency set up a time to come to the patient's home? not applicable Were any new equipment or medical supplies ordered?  No What is the name of the medical supply agency? N/A Were you able to get the supplies/equipment? not applicable Do you have any questions related to the use of the equipment or supplies? No  Functional Questionnaire: (I = Independent and D = Dependent) ADLs: I  Bathing/Dressing- I  Meal Prep- I  Eating- I  Maintaining continence- I  Transferring/Ambulation- I  Managing Meds- I  Follow up appointments reviewed:   PCP Hospital f/u appt confirmed? No    Specialist Hospital f/u appt confirmed? No    Are transportation arrangements needed? No   If their condition worsens, is the pt aware to call PCP or go to the Emergency Dept.? Yes  Was the patient provided with contact information for the PCP's office or ED? Yes  Was to pt encouraged to call back with questions or concerns? Yes

## 2021-02-06 LAB — CULTURE, BLOOD (ROUTINE X 2)
Culture: NO GROWTH
Special Requests: ADEQUATE

## 2023-01-24 ENCOUNTER — Emergency Department (HOSPITAL_COMMUNITY)
Admission: EM | Admit: 2023-01-24 | Discharge: 2023-01-25 | Disposition: A | Discharge status: Home / Self Care | Payer: Self-pay | Attending: Emergency Medicine | Admitting: Emergency Medicine

## 2023-01-24 ENCOUNTER — Emergency Department (HOSPITAL_COMMUNITY): Payer: Self-pay

## 2023-01-24 ENCOUNTER — Other Ambulatory Visit: Payer: Self-pay

## 2023-01-24 DIAGNOSIS — Y9301 Activity, walking, marching and hiking: Secondary | ICD-10-CM | POA: Insufficient documentation

## 2023-01-24 DIAGNOSIS — W01198A Fall on same level from slipping, tripping and stumbling with subsequent striking against other object, initial encounter: Secondary | ICD-10-CM | POA: Insufficient documentation

## 2023-01-24 DIAGNOSIS — W19XXXA Unspecified fall, initial encounter: Secondary | ICD-10-CM

## 2023-01-24 DIAGNOSIS — S50811A Abrasion of right forearm, initial encounter: Secondary | ICD-10-CM | POA: Insufficient documentation

## 2023-01-24 DIAGNOSIS — Y92000 Kitchen of unspecified non-institutional (private) residence as  the place of occurrence of the external cause: Secondary | ICD-10-CM | POA: Insufficient documentation

## 2023-01-24 DIAGNOSIS — M25571 Pain in right ankle and joints of right foot: Secondary | ICD-10-CM | POA: Insufficient documentation

## 2023-01-24 DIAGNOSIS — M542 Cervicalgia: Secondary | ICD-10-CM | POA: Insufficient documentation

## 2023-01-24 DIAGNOSIS — M25521 Pain in right elbow: Secondary | ICD-10-CM | POA: Insufficient documentation

## 2023-01-24 NOTE — ED Provider Notes (Signed)
[place] EMERGENCY DEPARTMENT AT [facility] Provider Note   CSN: [id number] Arrival date & time: 01/24/23  2129     History  Chief Complaint  Patient presents with   Tony Walsh is a 32 y.o. male. With past medical history of chronic hepatitis C, IVDU, cocaine abuse, schizoaffective disorder who presents to the emergency department with fall.  States before noon today he had a mechanical fall. States that he was walking around a convenient store when he tripped and fell on some products on the floor. States that he rolled his right ankle, fell, striking his head. He is unsure if he lost consciousness. He states that since then he has had headache, neck pain, right elbow and ankle pain. Denies nausea or vomiting or changes to his vision. Not anticoagulated.    Fall Associated symptoms include headaches.       Home Medications Prior to Admission medications   Medication Sig Start Date End Date Taking? Authorizing Provider  baclofen (LIORESAL) 10 MG tablet Take 10-20 mg by mouth 3 (three) times daily as needed for muscle spasms. 07/22/20   [provider]  gabapentin (NEURONTIN) 300 MG capsule Take 1 capsule (300 mg total) by mouth 3 (three) times daily. Patient not taking: No sig reported 06/02/20   Azzie Glatter, FNP  methocarbamol (ROBAXIN) 500 MG tablet Take 1 tablet (500 mg total) by mouth 2 (two) times daily. Patient not taking: No sig reported 03/30/20   Muthersbaugh, Jarrett Soho, PA-C  potassium chloride SA (K-DUR) 20 MEQ tablet Take 1 tablet (20 mEq total) by mouth 2 (two) times daily for 3 days. Patient not taking: No sig reported 07/19/19 07/22/19  Tegeler, Gwenyth Allegra, MD  QUEtiapine (SEROQUEL XR) 400 MG 24 hr tablet Take 1 tablet (400 mg total) by mouth at bedtime. 08/20/20 11/18/20  Vevelyn Francois, NP  SUBOXONE 8-2 MG FILM Place 1 Film under the tongue 3 (three) times daily. 07/22/20   [provider]      Allergies     Naproxen    Review of Systems   Review of Systems  Musculoskeletal:  Positive for neck pain.  Neurological:  Positive for headaches.  All other systems reviewed and are negative.   Physical Exam Updated Vital Signs BP (!) 159/92 (BP Location: Left Arm)   Pulse 77   Temp 98.4 F (36.9 C) (Oral)   Resp 19   Ht 6' 4"$  (1.93 m)   Wt 104.3 kg   SpO2 100%   BMI 27.99 kg/m  Physical Exam Vitals and nursing note reviewed.  Constitutional:      General: He is not in acute distress.    Appearance: Normal appearance. He is not ill-appearing or toxic-appearing.  HENT:     Head: Normocephalic and atraumatic.  Eyes:     General: No scleral icterus.    Extraocular Movements: Extraocular movements intact.  Cardiovascular:     Rate and Rhythm: Normal rate and regular rhythm.     Pulses: Normal pulses.     Heart sounds: No murmur heard. Pulmonary:     Effort: Pulmonary effort is normal. No respiratory distress.     Breath sounds: Normal breath sounds.  Abdominal:     General: Bowel sounds are normal.     Palpations: Abdomen is soft.  Musculoskeletal:        General: Tenderness present. No swelling.     Right shoulder: Normal.     Left shoulder: Normal.  Right elbow: No swelling, deformity or effusion. Normal range of motion. Tenderness present.     Cervical back: Normal range of motion and neck supple. Tenderness present. Spinous process tenderness and muscular tenderness present.     Right ankle: Swelling present. No deformity or ecchymosis. Tenderness present. Decreased range of motion. Normal pulse.  Skin:    General: Skin is warm and dry.     Capillary Refill: Capillary refill takes less than 2 seconds.     Findings: Abrasion present.     Comments: Multiple small abrasions to the right forearm. Hemostatic.   Neurological:     General: No focal deficit present.     Mental Status: He is alert and oriented to person, place, and time. Mental status is at baseline.   Psychiatric:        Mood and Affect: Mood normal.        Behavior: Behavior normal.        Thought Content: Thought content normal.        Judgment: Judgment normal.     ED Results / Procedures / Treatments   Labs (all labs ordered are listed, but only abnormal results are displayed) Labs Reviewed - No data to display  EKG None  Radiology CT HEAD WO CONTRAST  Result Date: 01/24/2023 CLINICAL DATA:  Blunt poly trauma mechanical fall earlier this morning. Head and neck pain EXAM: CT HEAD WITHOUT CONTRAST CT CERVICAL SPINE WITHOUT CONTRAST TECHNIQUE: Multidetector CT imaging of the head and cervical spine was performed following the standard protocol without intravenous contrast. Multiplanar CT image reconstructions of the cervical spine were also generated. RADIATION DOSE REDUCTION: This exam was performed according to the departmental dose-optimization program which includes automated exposure control, adjustment of the mA and/or kV according to patient size and/or use of iterative reconstruction technique. COMPARISON:  CT examination dated September 13, 2020 FINDINGS: CT HEAD FINDINGS Brain: No evidence of acute infarction, hemorrhage, hydrocephalus, extra-axial collection or mass lesion/mass effect. Vascular: No hyperdense vessel or unexpected calcification. Skull: Normal. Negative for fracture or focal lesion. Sinuses/Orbits: No acute finding. Other: None. CT CERVICAL SPINE FINDINGS Alignment: Straightening of the cervical spine. Skull base and vertebrae: No acute fracture. No primary bone lesion or focal pathologic process. Soft tissues and spinal canal: No prevertebral fluid or swelling. No visible canal hematoma. Hypodense nodule in the left thyroid lobe Disc levels: Disc heights are maintained. No significant disc bulge, spinal canal or neural foraminal stenosis Upper chest: Negative. Other: None IMPRESSION: CT head: 1.  No acute intracranial abnormality. CT cervical spine: 1. No acute  cervical spine fracture or subluxation. 2. Hypodense nodule in the left thyroid lobe. 3. Left thyroid hypodense nodule, thyroid sonogram on non emergent basis is suggested for further evaluation. Electronically Signed   By: Keane Police D.O.   On: 01/24/2023 23:35   CT CERVICAL SPINE WO CONTRAST  Result Date: 01/24/2023 CLINICAL DATA:  Blunt poly trauma mechanical fall earlier this morning. Head and neck pain EXAM: CT HEAD WITHOUT CONTRAST CT CERVICAL SPINE WITHOUT CONTRAST TECHNIQUE: Multidetector CT imaging of the head and cervical spine was performed following the standard protocol without intravenous contrast. Multiplanar CT image reconstructions of the cervical spine were also generated. RADIATION DOSE REDUCTION: This exam was performed according to the departmental dose-optimization program which includes automated exposure control, adjustment of the mA and/or kV according to patient size and/or use of iterative reconstruction technique. COMPARISON:  CT examination dated September 13, 2020 FINDINGS: CT HEAD FINDINGS Brain: No evidence  of acute infarction, hemorrhage, hydrocephalus, extra-axial collection or mass lesion/mass effect. Vascular: No hyperdense vessel or unexpected calcification. Skull: Normal. Negative for fracture or focal lesion. Sinuses/Orbits: No acute finding. Other: None. CT CERVICAL SPINE FINDINGS Alignment: Straightening of the cervical spine. Skull base and vertebrae: No acute fracture. No primary bone lesion or focal pathologic process. Soft tissues and spinal canal: No prevertebral fluid or swelling. No visible canal hematoma. Hypodense nodule in the left thyroid lobe Disc levels: Disc heights are maintained. No significant disc bulge, spinal canal or neural foraminal stenosis Upper chest: Negative. Other: None IMPRESSION: CT head: 1.  No acute intracranial abnormality. CT cervical spine: 1. No acute cervical spine fracture or subluxation. 2. Hypodense nodule in the left thyroid lobe.  3. Left thyroid hypodense nodule, thyroid sonogram on non emergent basis is suggested for further evaluation. Electronically Signed   By: Keane Police D.O.   On: 01/24/2023 23:35   DG Lumbar Spine Complete  Result Date: 01/24/2023 CLINICAL DATA:  Recent fall with low back pain, initial encounter EXAM: LUMBAR SPINE - COMPLETE 4+ VIEW COMPARISON:  None Available. FINDINGS: Five lumbar type vertebral bodies are well visualized. No pars defects are noted. Mild straightening of the normal lumbar lordosis is noted which may be related to muscular spasm. Mild disc space narrowing with osteophytic changes is seen at L3-4 and L4-5. No soft tissue abnormality is noted. IMPRESSION: Mild degenerative change without acute abnormality. Electronically Signed   By: Inez Catalina M.D.   On: 01/24/2023 22:48   DG Ankle 2 Views Right  Result Date: 01/24/2023 CLINICAL DATA:  Recent fall with ankle pain, initial encounter EXAM: RIGHT ANKLE - 2 VIEW COMPARISON:  None Available. FINDINGS: Mild soft tissue swelling is noted laterally. No acute fracture or dislocation is noted. No foreign body is noted. IMPRESSION: Lateral soft tissue swelling without acute bony abnormality. Electronically Signed   By: Inez Catalina M.D.   On: 01/24/2023 22:47   DG Shoulder Right  Result Date: 01/24/2023 CLINICAL DATA:  Recent fall with right shoulder pain, initial encounter EXAM: RIGHT SHOULDER - 2 VIEW COMPARISON:  None Available. FINDINGS: Humeral head is somewhat rotated although no dislocation is seen. No acute fracture is noted. Underlying bony thorax is within normal limits. IMPRESSION: Humeral rotation although no acute fracture is seen. Electronically Signed   By: Inez Catalina M.D.   On: 01/24/2023 22:46   DG Elbow 2 Views Right  Result Date: 01/24/2023 CLINICAL DATA:  Recent fall with right elbow pain, initial encounter EXAM: RIGHT ELBOW - 2 VIEW COMPARISON:  None Available. FINDINGS: No acute fracture or dislocation is noted. No  joint effusion is seen. Multiple needle fragments are noted within the soft tissues of the elbow consistent with the patient's given clinical history. IMPRESSION: No acute abnormality noted. Multiple radiopaque foreign bodies as described. Electronically Signed   By: Inez Catalina M.D.   On: 01/24/2023 22:43    Procedures Procedures   Medications Ordered in ED Medications - No data to display  ED Course/ Medical Decision Making/ A&P    Medical Decision Making Amount and/or Complexity of Data Reviewed Radiology: ordered.  Initial Impression and Ddx 32 year old male who presents to the emergency department with fall Patient PMH that increases complexity of ED encounter:  chronic hepatitis c, IVDU, schizoaffective disorder, cocaine abuse.  Differential: blunt or penetrating injury.   Interpretation of Diagnostics I independent reviewed and interpreted the labs as followed: n/a  - I independently visualized the following imaging with  scope of interpretation limited to determining acute life threatening conditions related to emergency care: plain films and CT head and c-spine as abov - no acute abnormalities.  Patient Reassessment and Ultimate Disposition/Management 32 year old male who presents with mechanical fall about 12 hours ago.  He is alert, oriented and appropriate on exam. No focal neuro exam. Mild c-spine TTP and TTP of the right ankle which is mildly swollen about the lateral malleolus. Small abrasions to the right elbow and forearm which do not require intervention.  Plain films of the right shoulder, elbow, ankle, L spine, CT head and neck are all negative. Patient was placed in c-collar on arrival which was cleared by me.  He is ambulating without difficulty, tolerating PO.   We discussed RICE therapy for his right ankle, ibuprofen for headache and ankle pain. Do not feel he requires further intervention at this time.  The patient has been appropriately medically screened  and/or stabilized in the ED. I have low suspicion for any other emergent medical condition which would require further screening, evaluation or treatment in the ED or require inpatient management. At time of discharge the patient is hemodynamically stable and in no acute distress. I have discussed work-up results and diagnosis with patient and answered all questions. Patient is agreeable with discharge plan. We discussed strict return precautions for returning to the emergency department and they verbalized understanding.     Patient management required discussion with the following services or consulting groups:  None  Complexity of Problems Addressed Acute complicated illness or Injury  Additional Data Reviewed and Analyzed Further history obtained from: Past medical history and medications listed in the EMR, Prior ED visit notes, and Care Everywhere  Patient Encounter Risk Assessment SDOH impact on management  Final Clinical Impression(s) / ED Diagnoses Final diagnoses:  Fall, initial encounter    Rx / DC Orders ED Discharge Orders     None         Mickie Hillier, PA-C 01/25/23 0005    Molpus, Jenny Reichmann, MD 01/25/23 [phone]

## 2023-01-24 NOTE — ED Notes (Signed)
C-collar applied in triage.

## 2023-01-24 NOTE — ED Notes (Signed)
Discussed pt with PA Madison R. Verbal order for CT scans placed

## 2023-01-24 NOTE — ED Triage Notes (Addendum)
Pt arrives via POV following a mechanical fall earlier this morning. Pt c/o back of head pain, neck pain, right elbow, right shoulder, mid to lower back, and right knee pain. Pt endorses +LOC.  Pt states taking a percocet from a friend this afternoon without relief. Pt A&Ox4. Pt ambulatory in lobby.

## 2023-01-24 NOTE — ED Notes (Signed)
When informing the pt of sending them back to the lobby from triage, while we wait for a room in the back to become available, the pt stated, " not to discriminate against him". The pt was inform there is no discrimination here and you are waiting like everyone else for an ED room in the back to become available.

## 2023-01-24 NOTE — Discharge Instructions (Signed)
You were seen in the emergency department today for a fall. Your x-rays and CT head and neck are all normal. Please use ice to your right ankle a few times a day over the next week as well as ibuprofen for pain. Please return for significantly worsening symptoms.

## 2023-01-27 ENCOUNTER — Telehealth: Payer: Self-pay

## 2023-01-27 NOTE — Transitions of Care (Post Inpatient/ED Visit) (Signed)
   01/27/2023  Name: Tony Walsh MRN: [id number] DOB: 08-02-1991  Today's TOC FU Call Status: Today's TOC FU Call Status:: Unsuccessul Call (1st Attempt) Unsuccessful Call (1st Attempt) Date: 01/27/23  Attempted to reach the patient regarding the most recent Inpatient/ED visit.  Follow Up Plan: Additional outreach attempts will be made to reach the patient to complete the Transitions of Care (Post Inpatient/ED visit) call.   [place] RMA

## 2023-01-29 ENCOUNTER — Ambulatory Visit (HOSPITAL_COMMUNITY)
Admission: EM | Admit: 2023-01-29 | Discharge: 2023-01-29 | Disposition: A | Discharge status: Home / Self Care | Payer: Self-pay | Attending: Family Medicine | Admitting: Family Medicine

## 2023-01-29 ENCOUNTER — Encounter (HOSPITAL_COMMUNITY): Payer: Self-pay

## 2023-01-29 DIAGNOSIS — S0990XA Unspecified injury of head, initial encounter: Secondary | ICD-10-CM

## 2023-01-29 DIAGNOSIS — S0181XA Laceration without foreign body of other part of head, initial encounter: Secondary | ICD-10-CM

## 2023-01-29 DIAGNOSIS — R4182 Altered mental status, unspecified: Secondary | ICD-10-CM

## 2023-01-29 NOTE — ED Triage Notes (Signed)
Pt states laceration over right brow happened 5 days ago after a fall.  States he is lightheaded and having vision problems. Pt seems to be drowsy at times and then hyper.

## 2023-01-29 NOTE — ED Notes (Signed)
Patient is being discharged from the Urgent Care and sent to the Emergency Department via private vehicle . Per Dr Mannie Stabile, patient is in need of higher level of care due to head injury. Patient is aware and verbalizes understanding of plan of care.  Vitals:   01/29/23 1716  BP: 130/72  Pulse: 74  Resp: 16  Temp: 97.7 F (36.5 C)  SpO2: 98%
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
